# Patient Record
Sex: Male | Born: 1955 | Race: White | Hispanic: No | Marital: Married | Smoking: Never smoker
Health system: Southern US, Community
[De-identification: ages and names within clinical notes are randomized; demographics above are authoritative.]

## PROBLEM LIST (undated history)

## (undated) DIAGNOSIS — G4730 Sleep apnea, unspecified: Secondary | ICD-10-CM

## (undated) DIAGNOSIS — J189 Pneumonia, unspecified organism: Secondary | ICD-10-CM

## (undated) DIAGNOSIS — C44310 Basal cell carcinoma of skin of unspecified parts of face: Secondary | ICD-10-CM

## (undated) DIAGNOSIS — K4090 Unilateral inguinal hernia, without obstruction or gangrene, not specified as recurrent: Secondary | ICD-10-CM

## (undated) DIAGNOSIS — Z87442 Personal history of urinary calculi: Secondary | ICD-10-CM

## (undated) DIAGNOSIS — I10 Essential (primary) hypertension: Secondary | ICD-10-CM

## (undated) HISTORY — PX: INGUINAL HERNIA REPAIR: SUR1180

## (undated) HISTORY — DX: Unilateral inguinal hernia, without obstruction or gangrene, not specified as recurrent: K40.90

## (undated) HISTORY — DX: Essential (primary) hypertension: I10

## (undated) HISTORY — PX: LITHOTRIPSY: SUR834

## (undated) HISTORY — PX: CYSTOSCOPY W/ STONE MANIPULATION: SHX1427

---

## 1997-09-27 ENCOUNTER — Emergency Department (HOSPITAL_COMMUNITY): Admission: EM | Admit: 1997-09-27 | Discharge: 1997-09-27 | Payer: Self-pay | Admitting: Internal Medicine

## 1998-03-19 ENCOUNTER — Ambulatory Visit (HOSPITAL_COMMUNITY): Admission: RE | Admit: 1998-03-19 | Discharge: 1998-03-19 | Payer: Self-pay | Admitting: Family Medicine

## 1998-03-19 ENCOUNTER — Encounter: Payer: Self-pay | Admitting: Family Medicine

## 1999-09-11 ENCOUNTER — Ambulatory Visit (HOSPITAL_COMMUNITY): Admission: RE | Admit: 1999-09-11 | Discharge: 1999-09-11 | Payer: Self-pay | Admitting: Urology

## 1999-09-11 ENCOUNTER — Encounter: Payer: Self-pay | Admitting: Urology

## 2001-08-09 ENCOUNTER — Encounter: Payer: Self-pay | Admitting: Emergency Medicine

## 2001-08-09 ENCOUNTER — Emergency Department (HOSPITAL_COMMUNITY): Admission: EM | Admit: 2001-08-09 | Discharge: 2001-08-09 | Payer: Self-pay | Admitting: Emergency Medicine

## 2001-10-26 ENCOUNTER — Ambulatory Visit (HOSPITAL_BASED_OUTPATIENT_CLINIC_OR_DEPARTMENT_OTHER): Admission: RE | Admit: 2001-10-26 | Discharge: 2001-10-26 | Payer: Self-pay | Admitting: Otolaryngology

## 2002-02-09 HISTORY — PX: NASAL SEPTUM SURGERY: SHX37

## 2002-03-16 ENCOUNTER — Ambulatory Visit (HOSPITAL_COMMUNITY): Admission: RE | Admit: 2002-03-16 | Discharge: 2002-03-17 | Payer: Self-pay | Admitting: Otolaryngology

## 2004-02-10 DIAGNOSIS — C44310 Basal cell carcinoma of skin of unspecified parts of face: Secondary | ICD-10-CM

## 2004-02-10 HISTORY — DX: Basal cell carcinoma of skin of unspecified parts of face: C44.310

## 2004-02-10 HISTORY — PX: MOHS SURGERY: SUR867

## 2006-01-20 ENCOUNTER — Ambulatory Visit: Payer: Self-pay | Admitting: Internal Medicine

## 2006-01-20 LAB — CONVERTED CEMR LAB
ALT: 31 units/L (ref 0–40)
AST: 27 units/L (ref 0–37)
Albumin: 4.1 g/dL (ref 3.5–5.2)
Alkaline Phosphatase: 60 units/L (ref 39–117)
BUN: 17 mg/dL (ref 6–23)
CO2: 29 meq/L (ref 19–32)
Calcium: 9.1 mg/dL (ref 8.4–10.5)
Chloride: 103 meq/L (ref 96–112)
Creatinine, Ser: 1.3 mg/dL (ref 0.4–1.5)
GFR calc non Af Amer: 63 mL/min
Glomerular Filtration Rate, Af Am: 76 mL/min/{1.73_m2}
Glucose, Bld: 86 mg/dL (ref 70–99)
HCT: 43.6 % (ref 39.0–52.0)
Hemoglobin: 14.9 g/dL (ref 13.0–17.0)
MCHC: 34.2 g/dL (ref 30.0–36.0)
MCV: 88.9 fL (ref 78.0–100.0)
Platelets: 211 10*3/uL (ref 150–400)
Potassium: 4.3 meq/L (ref 3.5–5.1)
RBC: 4.91 M/uL (ref 4.22–5.81)
RDW: 11.7 % (ref 11.5–14.6)
Sodium: 139 meq/L (ref 135–145)
TSH: 0.92 microintl units/mL (ref 0.35–5.50)
Total Bilirubin: 0.8 mg/dL (ref 0.3–1.2)
Total Protein: 6.1 g/dL (ref 6.0–8.3)
WBC: 7 10*3/uL (ref 4.5–10.5)

## 2006-02-09 HISTORY — PX: LITHOTRIPSY: SUR834

## 2006-11-10 ENCOUNTER — Encounter: Payer: Self-pay | Admitting: Internal Medicine

## 2006-11-21 ENCOUNTER — Emergency Department (HOSPITAL_COMMUNITY): Admission: EM | Admit: 2006-11-21 | Discharge: 2006-11-22 | Payer: Self-pay | Admitting: Emergency Medicine

## 2006-11-25 ENCOUNTER — Ambulatory Visit (HOSPITAL_BASED_OUTPATIENT_CLINIC_OR_DEPARTMENT_OTHER): Admission: RE | Admit: 2006-11-25 | Discharge: 2006-11-25 | Payer: Self-pay | Admitting: Urology

## 2006-12-24 ENCOUNTER — Encounter: Payer: Self-pay | Admitting: Internal Medicine

## 2007-01-20 ENCOUNTER — Ambulatory Visit (HOSPITAL_COMMUNITY): Admission: RE | Admit: 2007-01-20 | Discharge: 2007-01-20 | Payer: Self-pay | Admitting: Urology

## 2008-10-26 IMAGING — CR DG ABDOMEN 1V
1 series · 1 of 1 positions shown · non-contrast
Comparison: none

CLINICAL DATA: Right renal calculus, pre-lithotripsy today. 
ABDOMEN  - 1 VIEW:

[t abdomen supine]
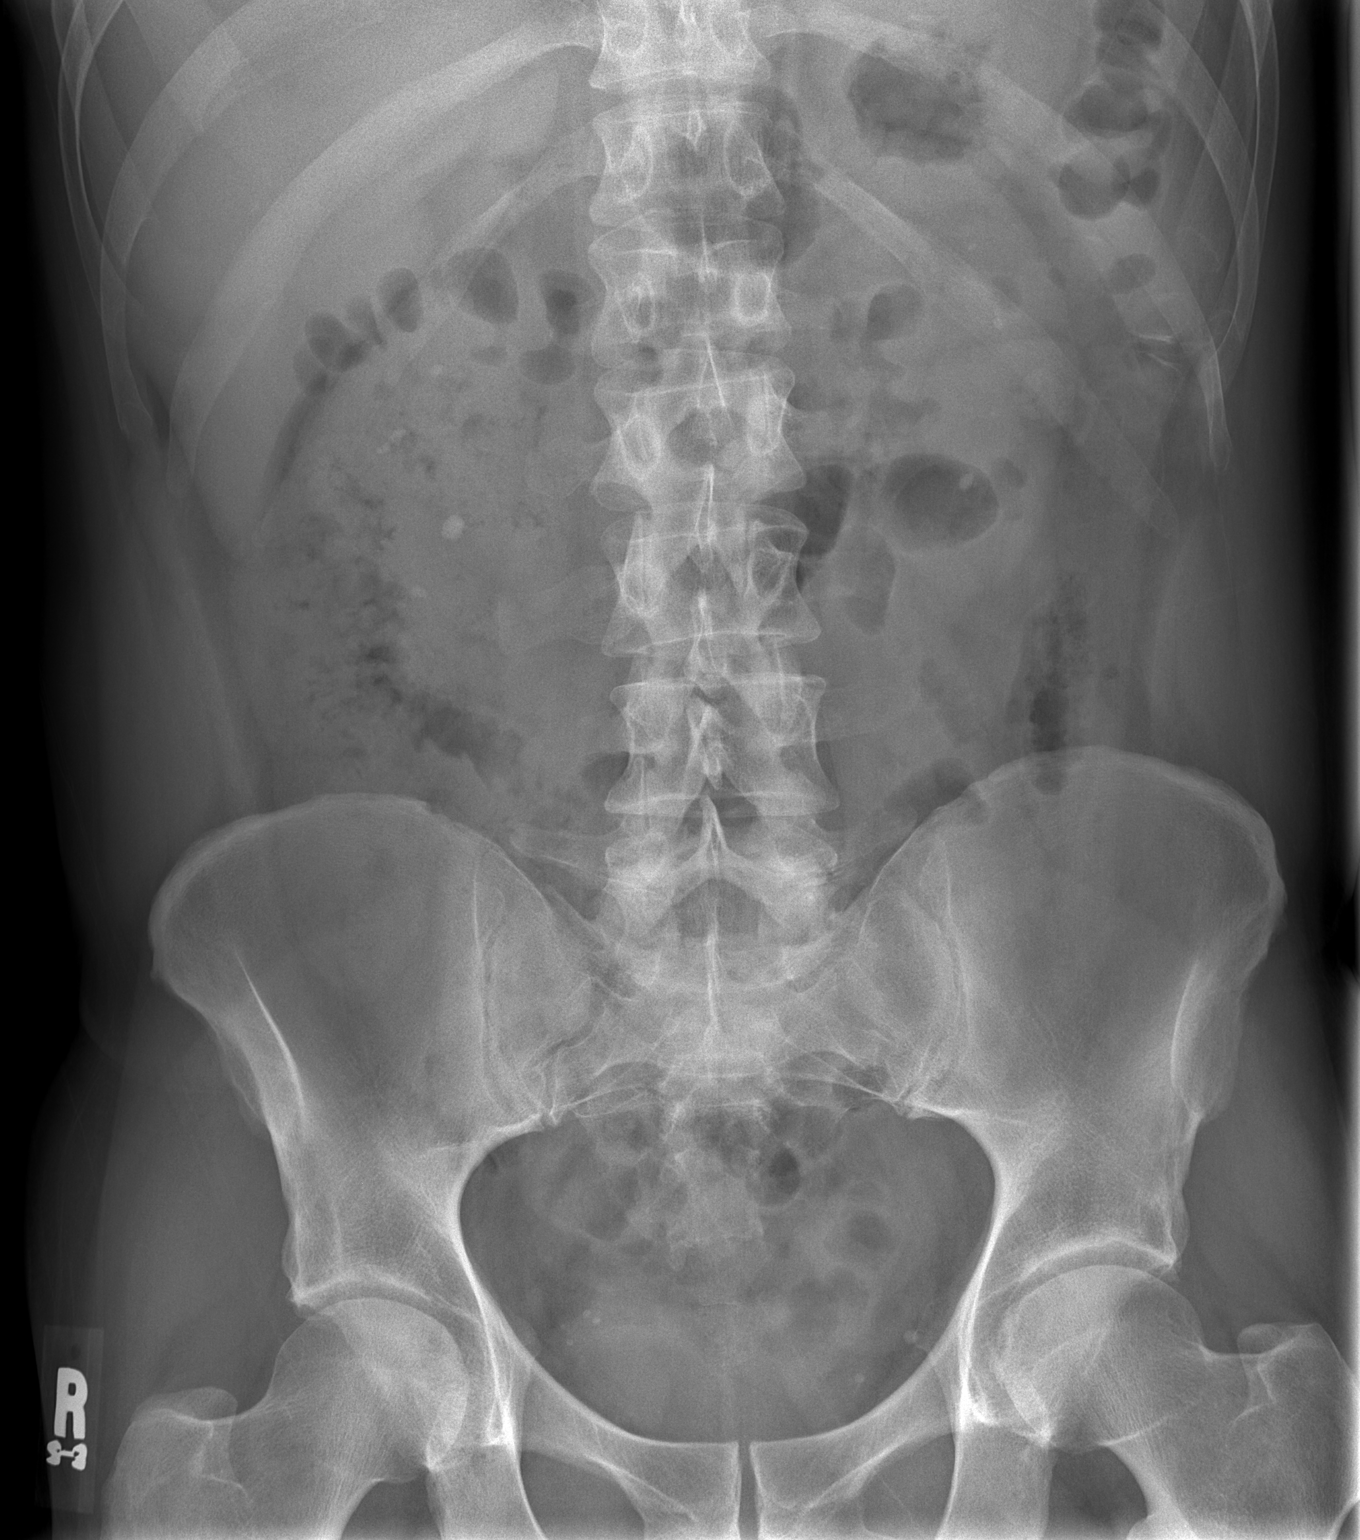

[1 of 1 positions shown; findings below may reference images not displayed]

FINDINGS: Multiple small calcifications in the low true pelvis compatible with phleboliths.  Multiple bilateral renal calcifications, the largest measuring 7.0 x 7.4 mm in the medial aspect of the right collecting system. It may lie in the renal pelvis.   The bowel gas pattern is unremarkable.
IMPRESSION: Bilateral renal calculi.

## 2009-01-23 ENCOUNTER — Telehealth: Payer: Self-pay | Admitting: Internal Medicine

## 2009-01-25 ENCOUNTER — Ambulatory Visit: Payer: Self-pay | Admitting: Family Medicine

## 2009-01-25 DIAGNOSIS — IMO0001 Reserved for inherently not codable concepts without codable children: Secondary | ICD-10-CM

## 2009-01-25 DIAGNOSIS — Z87442 Personal history of urinary calculi: Secondary | ICD-10-CM

## 2009-01-25 DIAGNOSIS — Z85828 Personal history of other malignant neoplasm of skin: Secondary | ICD-10-CM | POA: Insufficient documentation

## 2009-01-28 ENCOUNTER — Ambulatory Visit: Payer: Self-pay | Admitting: Internal Medicine

## 2009-01-28 LAB — CONVERTED CEMR LAB
ALT: 27 units/L (ref 0–53)
AST: 21 units/L (ref 0–37)
Albumin: 4.6 g/dL (ref 3.5–5.2)
Alkaline Phosphatase: 48 units/L (ref 39–117)
Anti Nuclear Antibody(ANA): NEGATIVE
BUN: 21 mg/dL (ref 6–23)
Basophils Absolute: 0 10*3/uL (ref 0.0–0.1)
Basophils Relative: 0 % (ref 0–1)
CO2: 28 meq/L (ref 19–32)
Calcium: 9.7 mg/dL (ref 8.4–10.5)
Chloride: 103 meq/L (ref 96–112)
Creatinine, Ser: 1.16 mg/dL (ref 0.40–1.50)
Eosinophils Absolute: 0.1 10*3/uL (ref 0.0–0.7)
Eosinophils Relative: 1 % (ref 0–5)
Folate: >20 ng/mL
Glucose, Bld: 87 mg/dL (ref 70–99)
HCT: 43.4 % (ref 39.0–52.0)
Hemoglobin: 15.2 g/dL (ref 13.0–17.0)
Lymphocytes Relative: 22 % (ref 12–46)
Lymphs Abs: 1.5 10*3/uL (ref 0.7–4.0)
MCHC: 35 g/dL (ref 30.0–36.0)
MCV: 85.6 fL (ref 78.0–100.0)
Monocytes Absolute: 0.6 10*3/uL (ref 0.1–1.0)
Monocytes Relative: 8 % (ref 3–12)
Neutro Abs: 4.6 10*3/uL (ref 1.7–7.7)
Neutrophils Relative %: 68 % (ref 43–77)
Platelets: 194 10*3/uL (ref 150–400)
Potassium: 4.1 meq/L (ref 3.5–5.3)
RBC: 5.07 M/uL (ref 4.22–5.81)
RDW: 12 % (ref 11.5–15.5)
Rheumatoid fact SerPl-aCnc: <20 intl units/mL (ref 0–20)
Sed Rate: 2 mm/hr (ref 0–16)
Sodium: 139 meq/L (ref 135–145)
TSH: 1.223 microintl units/mL (ref 0.350–4.500)
Total Bilirubin: 0.5 mg/dL (ref 0.3–1.2)
Total Protein: 6.8 g/dL (ref 6.0–8.3)
Vit D, 25-Hydroxy: 12 ng/mL — ABNORMAL LOW (ref 30–89)
Vitamin B-12: 441 pg/mL (ref 211–911)
WBC: 6.8 10*3/uL (ref 4.0–10.5)

## 2009-02-05 ENCOUNTER — Ambulatory Visit: Payer: Self-pay | Admitting: Internal Medicine

## 2009-02-06 ENCOUNTER — Encounter (INDEPENDENT_AMBULATORY_CARE_PROVIDER_SITE_OTHER): Payer: Self-pay | Admitting: *Deleted

## 2009-02-06 LAB — CONVERTED CEMR LAB
Cholesterol: 197 mg/dL (ref 0–200)
HDL: 40.5 mg/dL (ref >39.00)
LDL Cholesterol: 129 mg/dL — ABNORMAL HIGH (ref 0–99)
PSA: 0.9 ng/mL (ref 0.10–4.00)
Total CHOL/HDL Ratio: 5
Total CK: 96 units/L (ref 7–232)
Triglycerides: 139 mg/dL (ref 0.0–149.0)
VLDL: 27.8 mg/dL (ref 0.0–40.0)

## 2010-04-07 ENCOUNTER — Encounter: Payer: Self-pay | Admitting: Family Medicine

## 2010-04-07 ENCOUNTER — Ambulatory Visit (INDEPENDENT_AMBULATORY_CARE_PROVIDER_SITE_OTHER): Payer: BC Managed Care – PPO | Admitting: Family Medicine

## 2010-04-07 DIAGNOSIS — J019 Acute sinusitis, unspecified: Secondary | ICD-10-CM | POA: Insufficient documentation

## 2010-04-17 NOTE — Assessment & Plan Note (Signed)
Summary: cold and cough since Thursday///sph   Vital Signs:  Patient profile:   55 year old male Height:      68.5 inches Weight:      165 pounds O2 Sat:      98 % on Room air Temp:     97.6 degrees F oral Pulse rate:   65 / minute BP sitting:   122 / 78  (left arm)  Vitals Entered By: Jeremy Johann CMA (April 07, 2010 1:46 PM)  O2 Flow:  Room air CC: cough yellowish/greenish mucous, congestion,x4days   History of Present Illness: 55 yo man here today for cough.  sxs started Thursday w/ nasal congestion.  progressed into cough over the weekend.  now again is primarily nasal.  drainage is yellow green in color.  low grade temp on Saturday.  took Mucinex over the weekend w/ some relief.  + facial pressure.  + sick contacts.  Current Medications (verified): 1)  Ergocalciferol 50000 Unit Caps (Ergocalciferol) .Marland Kitchen.. 1 By Mouth Once A Week X 3 Months  Allergies (verified): 1)  ! Sulfa  Review of Systems      See HPI  Physical Exam  General:  alert, well-developed, and well-nourished.   Head:  NCAT, + TTP over frontal and maxillary sinuses Eyes:  no injxn or inflammation Ears:  External ear exam shows no significant lesions or deformities.  Otoscopic examination reveals clear canals, tympanic membranes are intact bilaterally without bulging, retraction, inflammation or discharge. Hearing is grossly normal bilaterally. Nose:  + congestion and turbinate edema Mouth:  Oral mucosa and oropharynx without lesions or exudates.  Teeth in good repair.  mild PND Lungs:  normal respiratory effort, no intercostal retractions, no accessory muscle use, and normal breath sounds.   Heart:  normal rate, regular rhythm, and no murmur.     Impression & Recommendations:  Problem # 1:  SINUSITIS - ACUTE-NOS (ICD-461.9) Assessment New pt's sxs consistent w/ infxn.  start amox.  reviewed contribution of nasal allergies- pt to start OTC Claritin or Zyrtec daily.  reviewed supportive care and red  flags that should prompt return.  Pt expresses understanding and is in agreement w/ this plan. His updated medication list for this problem includes:    Amoxicillin 875 Mg Tabs (Amoxicillin) .Marland Kitchen... 1 tab by mouth two times a day x10 days.  take w/ food.  Complete Medication List: 1)  Ergocalciferol 50000 Unit Caps (Ergocalciferol) .Marland Kitchen.. 1 by mouth once a week x 3 months 2)  Amoxicillin 875 Mg Tabs (Amoxicillin) .Marland Kitchen.. 1 tab by mouth two times a day x10 days.  take w/ food.  Patient Instructions: 1)  This appears to be an early sinus infection 2)  Take the Amoxicillin as directed 3)  Continue the Mucinex 4)  Drink plenty of fluids 5)  Tylenol/ibuprofen for pain or fever 6)  Rest! 7)  Hang in there!!! Prescriptions: AMOXICILLIN 875 MG TABS (AMOXICILLIN) 1 tab by mouth two times a day x10 days.  take w/ food.  #20 x 0   Entered and Authorized by:   Neena Rhymes MD   Signed by:   Neena Rhymes MD on 04/07/2010   Method used:   Electronically to        [facility] (retail)       [address]       [place], [place]  [address]       Ph: [phone]  Fax: [phone]   RxID:   [id number]    Orders Added: 1)  Est. Patient Level III [84696]

## 2010-06-24 NOTE — Op Note (Signed)
NAME:  Ricky Harrison, Ricky Harrison NO.:  [id number]   MEDICAL RECORD NO.:  [id number]          PATIENT TYPE:  AMB   LOCATION:  [facility]                         FACILITY:  [facility]   PHYSICIAN:  Valetta Fuller, M.D.  DATE OF BIRTH:  1955/12/24   DATE OF PROCEDURE:  11/25/2006  DATE OF DISCHARGE:                               OPERATIVE REPORT   PREOPERATIVE DIAGNOSIS:  Left distal ureteral calculus.   POSTOPERATIVE DIAGNOSIS:  Left distal ureteral calculus.   PROCEDURE PERFORMED:  Cystoscopy, left retrograde pyelography,  ureteroscopy with holmium laser lithotripsy, basketing of fragments and  left double-J stent placement.   SURGEON:  Valetta Fuller, M.D.   ANESTHESIA:  General.   INDICATIONS:  Mr. Skora is a 55 year old male.  He has had about 20  clinical stone events.  He has required several interventions.  He  recently developed gross hematuria.  At that time, he had what appeared  to be a 7- to 8-mm stone in his left renal pelvis, but he was  asymptomatic.  He subsequently developed severe pain.  The stone  eventually migrated to his proximal ureter.  We originally had him on  the schedule for lithotripsy, but because of ongoing pain, the patient  came back to the office and was re-imaged.  The stone was subsequently  over the sacrum, and we felt ureteroscopy would be a better treatment  option.  This was discussed with the patient at length, and he accepted  that recommendation and actually requested intervention.  He now  presents for definitive management of this 7-mm distal left ureteral  stone.   TECHNIQUE AND FINDINGS:  The patient was brought to the operating room  where he had successful induction of general anesthesia.  He was placed  in the lithotomy position and prepped and draped in the usual manner.  Cystoscopy itself was unremarkable.   Attention was then turned towards retrograde pyelography.  An 8-French  cone-tip catheter was utilized.   Approximately 6 cm above the ureteral  vesicle junction, there was a filling defect noted with mild obstruction  and mild dilation of the collecting system.  Fluoroscopic interpretation  was performed by myself.   Guidewire was then placed to the left renal pelvis with fluoroscopic  guidance.  The cystoscope was removed, and the distal ureter was engaged  with a 6.5 [place] ureteroscope.  A 7-mm stone was encountered in the  distal ureter just beneath the junction of the mid and distal ureter.  Holmium laser lithotriptor was used to break the stone into four  fragments.  These pieces were then all basket extracted.  We did feel  that the patient would benefit from double-J stent placement due  significant ureteral inflammation and edema.  A 24-cm 6-French double-J  stent was  placed with fluoroscopic as well as direct visual guidance.  He appeared  to tolerate the procedure well.  No obvious complications occurred.  He  was given lidocaine jelly and a B&O suppository and brought to the  recovery room in stable condition.  ______________________________  Valetta Fuller, M.D.  Electronically Signed     DSG/MEDQ  D:  11/25/2006  T:  11/26/2006  Job:  [id number]

## 2010-06-27 NOTE — Op Note (Signed)
[place]  Patient:    Ricky Harrison, Ricky Harrison                      MRN: [id number] Proc. Date: 09/11/99 Adm. Date:  [id number] Disc. Date: [id number] Attending:  Monica Becton                           Operative Report  PREOPERATIVE DIAGNOSES: 1. Distal right ureteral calculus. 2. Renal colic.  POSTOPERATIVE DIAGNOSES: 1. Distal right ureteral calculus. 2. Renal colic.  OPERATION:  Cystoscopy with right rigid ureteroscopy and stone manipulation, and insertion of a double-J stent.  SURGEON:  Claudette Laws, M.D.  INDICATIONS:  This is a 55 year old man who has had renal colic now for three to four days.  A KUB x-ray showed about a 4.0 mm stone in the area of the distal right ureter.  The patient has been forcing fluids, trying to pass the stone, without success.  We offered him a ureteroscopic stone extraction.  The nature of the procedure was explained to him, including the complications such as ureteral perforation or inability to actually get the stone out or urinary tract infection, but he understands and agrees to the proposed surgery.  DESCRIPTION OF PROCEDURE:  The patient was prepped in the dorsal lithotomy position under intubated general anesthesia.  A cystoscopy was performed.  A bladder urine was submitted for CNS.  The bladder itself was normal in appearance.  No tumors, no calculi.  Normal ureteral orifices.  He had a hemorrhagic prostate, slightly enlarged.  Initially I passed up the 0.038 guide wire through a 6-French open-ended ureteral catheter.  This was positioned in the kidney with C-arm control. Next, I back-loaded the 6.5-French short rigid mini-ureteroscope over the guide wire and directed it into the distal left ureter.  The stone was visualized.  At this point I thought I had dilated the orifice satisfactorily.  I then backed out the ureteroscope and then reintroduced it along side the guide wire under direct  vision.  Again it was introduced into the distal right ureter.  The stone was seen; however, initially I tried to pass a stone basket by the stone.  I was unsuccessful.  There appeared to be a false passage at the 6 oclock position.  We then switched to a three-prong grabber, but again the instrument seemed to direct itself into the false passage; however, I was able to manipulate the stone and loosen it up and bring it down a few mm.  At this point I thought it would be safer just to put up a double-J stent and dilate the ureter for a few days, anticipating that he will pass the stone spontaneously once we dilate the ureter.  I thought it was small enough.  The guide wire was then back-loaded over an Olympus cystoscope under direct vision, and the 6-French 26.0 cm double-J stent was passed up the ureter, again using C-arm control.  The proximal lung was curled up in the kidney. The distal end was curled up in the bladder.  The bladder was emptied.  All instruments were removed, and 10 cc of _______ was instilled intraurethrally, and the patient was then taken back to the recovery room in satisfactory condition.  PLAN:  Is now to keep the double-J stent in for several days, and then remove it in the office. DD:  09/11/99 TD:  09/12/99 Job: [id number]

## 2010-06-27 NOTE — Discharge Summary (Signed)
NAME:  Ricky Harrison, Ricky Harrison                         ACCOUNT NO.:  [id number]   MEDICAL RECORD NO.:  [id number]                   PATIENT TYPE:  OIB   LOCATION:  2903                                 FACILITY:  [facility]   PHYSICIAN:  Kinnie Scales. Annalee Genta, M.D.            DATE OF BIRTH:  1955-11-04   DATE OF ADMISSION:  03/16/2002  DATE OF DISCHARGE:  03/17/2002                                 DISCHARGE SUMMARY   ADMISSION DIAGNOSES:  1. Moderately severe obstructive sleep apnea.  2. Nasal airway obstruction.  3. Basal tongue hypertrophy.   DISCHARGE DIAGNOSES:  1. Moderately severe obstructive sleep apnea.  2. Nasal airway obstruction.  3. Basal tongue hypertrophy.   PROCEDURES:  1. Nasal septoplasty.  2. Radiofrequency ablation of basal tongue hypertrophy.  3. Inferior turbinate intermural cautery.   CONDITION ON DISCHARGE:  The patient is discharged home in stable condition  in the company of his wife.   DISCHARGE MEDICATIONS:  1. Lortab elixir 2-3 teaspoons q.4-6h. as needed for pain control.  Dispense     300 mL with 1 refill.  2. Augmentin 500 mg p.o. b.i.d. for 10 days.  3. Vioxx 50 mg p.o. daily for 10 days.   DISCHARGE INSTRUCTIONS:  Pain management as above.  Activity is limited.  No  lifting, straining, or nose blowing.  Diet:  Liquid and soft diet as  tolerated.  Wound Care:  The patient will begin frequent nasal saline sprays  4 puffs per nostril every hour while awake.  He will follow up with me on  03/23/02; sooner as required.   BRIEF HISTORY:  The patient is a 55 year old white male who is referred for  evaluation of snoring, chronic nasal airway obstruction and obstructive  sleep apnea.  A patient sleep study was performed and it showed moderately  severe obstructive sleep apnea with RDI of 40, 6 events per hour, and a  moderate oxygen desaturation.  The patient was titrated on CPAP and he used  home CPAP for approximately 3-1/2 months.  He was unable to tolerate  it for  a long-term solution of his sleep apnea secondary to chronic nasal pressure  and congestion and discomfort.   Due to the patient's history and examination I recommended that we consider  him for nasal septoplasty and turbinate reduction to improve the nasal  airway and basal tongue radiofrequency ablation to reduce posterior airway  obstruction.   Prior to admission the risks, benefits, and possible complications of the  above surgical procedures were discussed in detail with the patient and his  wife and they understood and concurred with our plan for the surgery.   HOSPITAL COURSE:  The patient was admitted to the ENT service at [facility] under Dr. Thurmon Fair care on 03/16/02.  He was brought to the main  operating room at [facility] and under general anesthesia underwent  nasal septoplasty, bilateral inferior turbinate  reduction, and base and  tongue radiofrequency ablation.   Surgery was performed without complications or difficulty.  The patient was  transferred from the operating room to the recovery room in stable  condition.  After adequate recovery he was transferred to unit 2900 for  postoperative monitoring and care.  Postoperative monitoring over the first  22 hours included cardiac and pulse oximetry.  The patient was stable  throughout his first postoperative night with blood oxygen levels greater  than 95% on room air.  There is no evidence of significant airway  obstruction and the patient was comfortable.  The pain was well-controlled  with the prescribed pain medications.  He was tolerated a liquid and soft  oral diet without difficulty. He is ambulating well.  Bowel and bladder  function was normal without problems or complications.  He was discharged to  home in stable condition in the company of his wife with the above discharge  instructions.  The patient and his wife are comfortable with the discharge  planning.  He will follow up  with me in 1 week for further postoperative  care or sooner is warranted.                                               Kinnie Scales. Annalee Genta, M.D.    DLS/MEDQ  D:  16/11/9602  T:  03/17/2002  Job:  [id number]

## 2010-06-27 NOTE — Assessment & Plan Note (Signed)
[facility]                        [place] OFFICE NOTE   BORNA, WESSINGER                      MRN:          [id number]  DATE:01/20/2006                            DOB:          [birth year]    CHIEF COMPLAINT:  Fatigue.   HISTORY OF PRESENT ILLNESS:  Mr. Kepner is a 55 year old gentleman who  came here with his wife.  He has been experiencing episodes of extreme  fatigue on and off for the last year.  These episodes last only 5 to 10  minutes.  Initially, they were every 2 months and now they are more  frequent, like once a week.  There are no obvious triggers to these  events, and there are no associated chest pains, palpitations, or  headaches.  He did admit to having severe neck pain and some shoulder  pain bilaterally with these episodes, and also states that his muscles  feel locked during these spells.   PAST MEDICAL HISTORY:  1. Kidney stones several times.  2. Skin cancer treated by dermatology.  3. Status post surgery for a deviated septum.  4. Status post lithotripsy x2.   FAMILY HISTORY:  1. No history of colon or prostate cancer, or strokes.  2. Father had hypertension.  3. Father also had an MI at the age of 70.  He used to be a heavy      smoker and drinker.  4. No history of diabetes.   SOCIAL HISTORY:  Does not smoke or drink.  He is married.  They have 2  children.   MEDICATIONS:  None.   ALLERGIES:  SULFA.   REVIEW OF SYSTEMS:  Denies any fever, weight loss, diplopia, shortness  of breath, nausea.  Occasionally will have diarrhea, but no blood in the  stools.  No paresthesia.  He does admit that he was under a lot of  stress, but here lately, he is much better, and denies depression.  He  snores a lot at night.   PHYSICAL EXAM:  The patient is alert and oriented, in no apparent  distress.  He is 5 feet 7-1/4 inches tall.  Weight 159 pounds.  Afebrile.  Pulse  60.  Blood pressure 110/70.  NECK:  No  thyromegaly.  LUNGS:  Clear to auscultation bilaterally.  CARDIOVASCULAR:  Regular rate and rhythm without a murmur.  Carotid  pulses are normal.  ABDOMEN:  Nondistended.  Soft.  Good bowel sounds.  No organomegaly.  EXTREMITIES:  No edema.  NEUROLOGIC:  Reflexes are symmetric.  Muscle tone is normal.  Strength  is symmetric.  Speech is clear and fluent.   ASSESSMENT AND PLAN:  The patient presents today with episodes of  fatigue without obvious triggers.  His EKG today is normal.  The  etiology is not completely clear at this point.  The only associated  symptom is neck pain, but this is not constant and he does not have any  evidence of myelopathy on physical exam.  At this point, I talked with  the patient and we agreed that we are going to check a CBC and  comprehensive panel to rule out hypokalemia, also a TSH and a  urinalysis.  If the results are normal, he will come back in 2 months  for a checkup, or sooner if he has increase of his fatigue.     Willow Ora, MD  Electronically Signed    JP/MedQ  DD: 01/20/2006  DT: 01/20/2006  Job #: [id number]

## 2010-06-27 NOTE — Op Note (Signed)
NAME:  Ricky Harrison, Ricky Harrison                         ACCOUNT NO.:  [id number]   MEDICAL RECORD NO.:  [id number]                   PATIENT TYPE:  OIB   LOCATION:  2888                                 FACILITY:  [facility]   PHYSICIAN:  Kinnie Scales. Annalee Genta, M.D.            DATE OF BIRTH:  [birth year]   DATE OF PROCEDURE:  03/16/2002  DATE OF DISCHARGE:                                 OPERATIVE REPORT   PREOPERATIVE DIAGNOSES:  1. Moderately severe obstructive sleep apnea.  2. Nasal septal deviation with nasal obstruction.  3. Base of tongue hypertrophy.  4. Bilateral inferior turbinate nasal hypertrophy.   POSTOPERATIVE DIAGNOSES:  1. Moderately severe obstructive sleep apnea.  2. Nasal septal deviation with nasal obstruction.  3. Base of tongue hypertrophy.  4. Bilateral inferior turbinate nasal hypertrophy.   INDICATION FOR PROCEDURE:  1. Moderately severe obstructive sleep apnea.  2. Nasal septal deviation with nasal obstruction.  3. Base of tongue hypertrophy.  4. Bilateral inferior turbinate nasal hypertrophy.   PROCEDURES:  1. Nasal septoplasty.  2. Radiofrequency ablation of the base of tongue.  3. Bilateral inferior turbinate intramural cautery.   ANESTHESIA:  General endotracheal.   SURGEON:  Kinnie Scales. Annalee Genta, M.D.   COMPLICATIONS:  None.   ESTIMATED BLOOD LOSS:  Less than 100 mL.   DISPOSITION:  The patient was transferred from the operating room to the  recovery room in stable condition.  There were no complications.   BRIEF HISTORY:  The patient is a 55 year old white male who was referred for  evaluation and management of moderately severe obstructive sleep apnea.  The  patient had undergone an inpatient sleep study, which showed an RDI of 46  events per hour.  The patient had been titrated on nasal CPAP and was using  it at home for approximately three months.  Unfortunately, he was not  noticing significant improvement in daytime symptoms of fatigue and  hypersomnolence and was having significant difficulty wearing his CPAP on an  ongoing basis secondary to nasal pressure, congestion, and discomfort.  Given that history and findings, I have recommended that we consider him for  surgical intervention.  We were anticipating a staged approach to his  obstructive sleep apnea beginning with nasal septoplasty and turbinate  reduction and primary base of tongue radiofrequency ablation.  The risks  benefits, and possible complications of each of these surgical procedures  were discussed in detail with the patient, who understood and concurred with  our plan for surgery and was scheduled at [facility] main OR with a  23-hour postoperative observation in the intensive care unit to monitor for  worsening obstructive sleep apnea.   DESCRIPTION OF PROCEDURE:  The patient was brought to the operating room on  03/16/02 and placed in the supine position on the operating table.  General  endotracheal anesthesia was established without difficulty, and when the  patient was adequately anesthetized, he was  injected with 12 mL of 1%  lidocaine and 1:100,000 solution of epinephrine injected in a submucosal  fashion along the nasal septum and inferior turbinates bilaterally.  The  patient's nose was then packed with Afrin-soaked cottonoid pledgets and left  in place for approximately 10 minutes for adequate vasoconstriction.  The  patient was prepped and draped in a sterile fashion and the surgical  procedure was begun with nasal septoplasty, inferior turbinate reduction.  The cotton pledgets were removed.  The patient was found to have a severely  deviated nasal septum with bilateral nasal obstruction and a large right  inferior septal spur.  A right hemitransfixion incision was created and a  mucoperichondrial flap was elevated from anterior to posterior along the  patient's right-hand side.  The bony-cartilaginous junction was crossed in  the midline  and a mucoperiosteal flap was elevated on the patient's left-  hand side.  Deviated bone and cartilage in the mid- and posterior aspects of  the nasal septum were then resected.  Anterior columella and dorsal  cartilage was not deviated, and this was left intact.  Cartilage from the  midaspect of the septum was removed, morcellized, and returned to the  mucoperichondrial pocket, and a 4 mm osteotome was used to remove a large  right inferior septal bone spur, preserving the overlying mucosa.  The  mucoperichondrial flaps were reapproximated with a 4-0 gut suture on a Keith  needle in a horizontal mattressing fashion.  The hemitransfixion incision  was closed with the same stitch, and bilateral Doyle nasal septal splints  were placed and sutured in position with 3-0 Ethilon suture after the  application of Bactroban ointment.   Attention was then turned to the inferior turbinates, where bilateral  inferior turbinate intramural cautery was performed.  With the cautery set  at 12 watts, two passes were made in submucosal fashion in each inferior  turbinate.  The turbinates were adequately cauterized and outfractured to  create a more patent nasal cavity.  The patient's nasal cavity and  nasopharynx were then irrigated and suctioned.  Attention was then turned to  the patient's oral cavity.  He was found to have significant base of tongue  hypertrophy.  Using the somnoplasty device with a dual probe tongue base  handpiece, radiofrequency ablation of the tongue base was undertaken.  The  procedure was accomplished with three midline lesions beginning  approximately 1 cm posterior to the circumvallate papillae.  Each surgical  site was marked and injected with approximately 5 mL of sterile saline using  a 20-gauge spinal needle.  After each injection, 1200 joules of energy were  delivered through the dual handpiece into the tongue base.  This was done three times, 1 cm posterior to the  circumvallate papillae, at the level of  the circumvallate papillae, and then 1 cm anterior.  It was accomplished  without complication or difficulty, and there was minimal bleeding.  The  patient's oral cavity and oropharynx were then irrigated and suctioned.  An  orogastric tube was passed and the stomach contents were aspirated.  The  patient was then awakened from his anesthetic.  He was extubated and was  taken from the operating room to the recovery room in stable condition.  There were no complications.  Estimated blood loss was less than 100 mL.  Kinnie Scales. Annalee Genta, M.D.    DLS/MEDQ  D:  16/11/9602  T:  03/16/2002  Job:  [id number]

## 2010-11-20 LAB — DIFFERENTIAL
Basophils Absolute: 0
Eosinophils Absolute: 0
Eosinophils Relative: 0
Lymphocytes Relative: 6 — ABNORMAL LOW
Monocytes Absolute: 0.7

## 2010-11-20 LAB — URINALYSIS, ROUTINE W REFLEX MICROSCOPIC
Bilirubin Urine: NEGATIVE
Glucose, UA: NEGATIVE
Protein, ur: NEGATIVE
Urobilinogen, UA: 0.2

## 2010-11-20 LAB — CBC
HCT: 40.7
Hemoglobin: 14.4
MCV: 86.4
Platelets: 180
RDW: 11.7

## 2010-11-20 LAB — BASIC METABOLIC PANEL
BUN: 18
CO2: 30
Chloride: 98
GFR calc non Af Amer: 35 — ABNORMAL LOW
Glucose, Bld: 116 — ABNORMAL HIGH
Potassium: 4.1
Sodium: 137

## 2010-11-20 LAB — URINE MICROSCOPIC-ADD ON

## 2011-07-13 ENCOUNTER — Ambulatory Visit (INDEPENDENT_AMBULATORY_CARE_PROVIDER_SITE_OTHER): Payer: BC Managed Care – PPO | Admitting: Family Medicine

## 2011-07-13 ENCOUNTER — Encounter: Payer: Self-pay | Admitting: Family Medicine

## 2011-07-13 VITALS — BP 112/70 | HR 66 | Temp 98.3°F | Ht 67.0 in | Wt 161.6 lb

## 2011-07-13 DIAGNOSIS — J019 Acute sinusitis, unspecified: Secondary | ICD-10-CM

## 2011-07-13 MED ORDER — BENZONATATE 200 MG PO CAPS: 200.0000 mg | ORAL_CAPSULE | Freq: Three times a day (TID) | ORAL | Status: AC | PRN

## 2011-07-13 MED ORDER — AMOXICILLIN 875 MG PO TABS: 875.0000 mg | ORAL_TABLET | Freq: Two times a day (BID) | ORAL | Status: DC

## 2011-07-13 NOTE — Patient Instructions (Signed)
This is a sinus infection Start the Amoxicillin twice daily- take w/ food Use the cough meds as needed Drink plenty of fluids REST! Hang in there!!!

## 2011-07-13 NOTE — Assessment & Plan Note (Signed)
Pt's sxs and PE consistent w/ infxn.  Start abx.  Reviewed supportive care and red flags that should prompt return.  Pt expressed understanding and is in agreement w/ plan.  

## 2011-07-13 NOTE — Progress Notes (Signed)
  Subjective:    Patient ID: Ricky Harrison, male    DOB: 08-08-1955, 56 y.o.   MRN: [id number]  HPI URI- sxs started Thursday w/ nasal congestion, itchy watery eyes, cough, malaise.  + body aches.  No fevers.  + sick contacts.  Nasal drainage now thicker and yellow.  Hx of sinus infxn previously.  Some maxillary sinus pressure.  + nausea, no vomiting, no diarrhea.   Review of Systems For ROS see HPI     Objective:   Physical Exam  Vitals reviewed. Constitutional: He appears well-developed and well-nourished. No distress.  HENT:  Head: Normocephalic and atraumatic.  Right Ear: Tympanic membrane normal.  Left Ear: Tympanic membrane normal.  Nose: Mucosal edema and rhinorrhea present. Right sinus exhibits maxillary sinus tenderness. Right sinus exhibits no frontal sinus tenderness. Left sinus exhibits maxillary sinus tenderness. Left sinus exhibits no frontal sinus tenderness.  Mouth/Throat: Mucous membranes are normal. Oropharyngeal exudate and posterior oropharyngeal erythema present. No posterior oropharyngeal edema.       + PND  Eyes: Conjunctivae and EOM are normal. Pupils are equal, round, and reactive to light.  Neck: Normal range of motion. Neck supple.  Cardiovascular: Normal rate, regular rhythm and normal heart sounds.   Pulmonary/Chest: Effort normal and breath sounds normal. No respiratory distress. He has no wheezes.       + hacking cough  Lymphadenopathy:    He has no cervical adenopathy.  Skin: Skin is warm and dry.          Assessment & Plan:

## 2011-11-24 ENCOUNTER — Encounter: Payer: Self-pay | Admitting: Internal Medicine

## 2011-11-24 ENCOUNTER — Ambulatory Visit (INDEPENDENT_AMBULATORY_CARE_PROVIDER_SITE_OTHER): Payer: BC Managed Care – PPO | Admitting: Internal Medicine

## 2011-11-24 VITALS — BP 118/82 | HR 72 | Temp 98.2°F | Wt 160.0 lb

## 2011-11-24 DIAGNOSIS — K4090 Unilateral inguinal hernia, without obstruction or gangrene, not specified as recurrent: Secondary | ICD-10-CM | POA: Insufficient documentation

## 2011-11-24 NOTE — Progress Notes (Signed)
  Subjective:    Patient ID: Ricky Harrison, male    DOB: [birth year], 56 y.o.   MRN: [id number]  HPI Acute visit One month history of on and off bulging mass in the left inguinal area. Some discomfort.   Past Medical History: Nephrolithiasis, passed several stones before ,sees urology from time to time  Skin cancer, hx of-- [facility]  Past Surgical History: lithotripsy  deviated septum skin Ca ([facility]) 3 different places on face   Family History: Family History of  CAD-- MI F   at 54yo F - deceased MI age 39 PVD-- F carotid artery surgery age 53 M- living DM - no stroke - no HTN - F colon Ca - no prostate Ca - no  Social History: Occupation: Ecologist, 2 CHILDREN   Never Smoked Alcohol use-yes Drug use-no   Review of Systems Denies abdominal pain, nausea vomiting. No fever chills No dysuria gross hematuria No scrotal pain or swelling.    Objective:   Physical Exam General -- alert, well-developed, and well-nourished.    Abdomen--soft, non-tender, no distention, no masses, no HSM, no guarding, and no rigidity. Right inguinal area normal Left inguinal area has a reducible, soft, nontender mass of approximately 4x3 cm. Very mild discomfort with palpation. GU-- scrotum and scrotal contents normal, penis without lesions or discharge or  Extremities-- no pretibial edema bilaterally Neurologic-- alert & oriented X3 and strength normal in all extremities. Psych-- Cognition and judgment appear intact. Alert and cooperative with normal attention span and concentration.  not anxious appearing and not depressed appearing.        Assessment & Plan:    Impression Recommend to come back for a physical at his convenience

## 2011-11-24 NOTE — Assessment & Plan Note (Signed)
Symptoms findings consistent with inguinal hernia, signs of incarceration discussed, if that happens needs to go to the ER. Otherwise, will refer him to surgery for possible hernia correction.

## 2011-11-24 NOTE — Patient Instructions (Addendum)

## 2011-12-14 ENCOUNTER — Ambulatory Visit (INDEPENDENT_AMBULATORY_CARE_PROVIDER_SITE_OTHER): Payer: BC Managed Care – PPO | Admitting: General Surgery

## 2011-12-14 ENCOUNTER — Encounter (INDEPENDENT_AMBULATORY_CARE_PROVIDER_SITE_OTHER): Payer: Self-pay | Admitting: General Surgery

## 2011-12-14 VITALS — BP 152/100 | HR 76 | Temp 97.8°F | Resp 16 | Ht 68.0 in | Wt 158.5 lb

## 2011-12-14 DIAGNOSIS — K4090 Unilateral inguinal hernia, without obstruction or gangrene, not specified as recurrent: Secondary | ICD-10-CM

## 2011-12-14 NOTE — Patient Instructions (Signed)
You have a reducible left inguinal hernia.  We have discussed the natural history of your hernia, and the different techniques of repair.  You'll be scheduled for a laparoscopic repair of your left inguinal hernia with mesh, possible open repair, in the near future.      Hernia Repair with Laparoscope A hernia occurs when an internal organ pushes out through a weak spot in the belly (abdominal) wall muscles. Hernias most commonly occur in the groin and around the navel. Hernias can also occur through a cut by the surgeon (incision) after an abdominal operation. A hernia may be caused by:  Lifting heavy objects.  Prolonged coughing.  Straining to move your bowels. Hernias can often be pushed back into place (reduced). Most hernias tend to get worse over time. Problems occur when abdominal contents get stuck in the opening and the blood supply is blocked or impaired (incarcerated hernia). Because of these risks, you require surgery to repair the hernia. Your hernia will be repaired using a laparoscope. Laparoscopic surgery is a type of minimally invasive surgery. It does not involve making a typical surgical cut (incision) in the skin. A laparoscope is a telescope-like rod and lens system. It is usually connected to a video camera and a light source so your caregiver can clearly see the operative area. The instruments are inserted through  to  inch (5 mm or 10 mm) openings in the skin at specific locations. A working and viewing space is created by blowing a small amount of carbon dioxide gas into the abdominal cavity. The abdomen is essentially blown up like a balloon (insufflated). This elevates the abdominal wall above the internal organs like a dome. The carbon dioxide gas is common to the human body and can be absorbed by tissue and removed by the respiratory system. Once the repair is completed, the small incisions will be closed with either stitches (sutures) or staples (just like a paper  stapler only this staple holds the skin together). LET YOUR CAREGIVERS KNOW ABOUT:  Allergies.  Medications taken including herbs, eye drops, over the counter medications, and creams.  Use of steroids (by mouth or creams).  Previous problems with anesthetics or Novocaine.  Possibility of pregnancy, if this applies.  History of blood clots (thrombophlebitis).  History of bleeding or blood problems.  Previous surgery.  Other health problems. BEFORE THE PROCEDURE  Laparoscopy can be done either in a hospital or out-patient clinic. You may be given a mild sedative to help you relax before the procedure. Once in the operating room, you will be given a general anesthesia to make you sleep (unless you and your caregiver choose a different anesthetic).  AFTER THE PROCEDURE  After the procedure you will be watched in a recovery area. Depending on what type of hernia was repaired, you might be admitted to the hospital or you might go home the same day. With this procedure you may have less pain and scarring. This usually results in a quicker recovery and less risk of infection. HOME CARE INSTRUCTIONS   Bed rest is not required. You may continue your normal activities but avoid heavy lifting (more than 10 pounds) or straining.  Cough gently. If you are a smoker it is best to stop, as even the best hernia repair can break down with the continual strain of coughing.  Avoid driving until given the OK by your surgeon.  There are no dietary restrictions unless given otherwise.  TAKE ALL MEDICATIONS AS DIRECTED.  Only take  over-the-counter or prescription medicines for pain, discomfort, or fever as directed by your caregiver. SEEK MEDICAL CARE IF:   There is increasing abdominal pain or pain in your incisions.  There is more bleeding from incisions, other than minimal spotting.  You feel light headed or faint.  You develop an unexplained fever, chills, and/or an oral temperature above  102 F (38.9 C).  You have redness, swelling, or increasing pain in the wound.  Pus coming from wound.  A foul smell coming from the wound or dressings. SEEK IMMEDIATE MEDICAL CARE IF:   You develop a rash.  You have difficulty breathing.  You have any allergic problems. MAKE SURE YOU:   Understand these instructions.  Will watch your condition.  Will get help right away if you are not doing well or get worse. Document Released: 01/26/2005 Document Revised: 04/20/2011 Document Reviewed: 12/26/2008 [facility] Patient Information 2013 [place], [place].

## 2011-12-14 NOTE — Progress Notes (Signed)
Patient ID: Ricky Harrison, male   DOB: 09/28/1955, 56 y.o.   MRN: [id number]  Chief Complaint  Patient presents with  . Inguinal Hernia    HPI Ricky Harrison is a 56 y.o. male.  He is referred to me by Dr. Drue Novel for evaluation of a left inguinal hernia.  This is a healthy gentleman who works in [employer]. He runs frequently. He has noticed a bulge in his left groin for about 6 weeks. This is medium-sized. He can push it back in. He has minimal pressure symptoms. No severe pain. No history of incarceration. No prior history of hernia repair.  Has had 5 lithotripsies in the past, followed by Dr. Isabel Caprice   Recent episode of hematuria which was very transient and self-limited, basically a single episode. Urine clear now. HPI  Past Medical History  Diagnosis Date  . Kidney stones   . Inguinal hernia   . Cancer 2006    basal cell skin     Past Surgical History  Procedure Date  . Lithotripsy 2008    5 times total - Dr. Isabel Caprice  . Nasal septum surgery 2004    Dr. Annalee Genta  . Mohs surgery 2006    basal cell - skin    Family History  Problem Relation Age of Onset  . Cancer Mother     throat  . Heart attack Father     Social History History  Substance Use Topics  . Smoking status: Never Smoker   . Smokeless tobacco: Not on file  . Alcohol Use: No    Allergies  Allergen Reactions  . Sulfonamide Derivatives Rash    At injection site. Made the entire vein that was injected red and spread up the arm.    No current outpatient prescriptions on file.    Review of Systems Review of Systems  Constitutional: Negative for fever, chills and unexpected weight change.  HENT: Negative for hearing loss, congestion, sore throat, trouble swallowing and voice change.   Eyes: Negative for visual disturbance.  Respiratory: Negative for cough and wheezing.   Cardiovascular: Negative for chest pain, palpitations and leg swelling.  Gastrointestinal: Negative for nausea, vomiting,  abdominal pain, diarrhea, constipation, blood in stool, abdominal distention, anal bleeding and rectal pain.  Genitourinary: Positive for hematuria. Negative for difficulty urinating.  Musculoskeletal: Negative for arthralgias.  Skin: Negative for rash and wound.  Neurological: Negative for seizures, syncope, weakness and headaches.  Hematological: Negative for adenopathy. Does not bruise/bleed easily.  Psychiatric/Behavioral: Negative for confusion.    Blood pressure 152/100, pulse 76, temperature 97.8 F (36.6 C), temperature source Temporal, resp. rate 16, height 5\' 8"  (1.727 m), weight 158 lb 8 oz (71.895 kg).  Physical Exam Physical Exam  Constitutional: He is oriented to person, place, and time. He appears well-developed and well-nourished. No distress.  HENT:  Head: Normocephalic.  Nose: Nose normal.  Mouth/Throat: No oropharyngeal exudate.  Eyes: Conjunctivae normal and EOM are normal. Pupils are equal, round, and reactive to light. Right eye exhibits no discharge. Left eye exhibits no discharge. No scleral icterus.  Neck: Normal range of motion. Neck supple. No JVD present. No tracheal deviation present. No thyromegaly present.  Cardiovascular: Normal rate, regular rhythm, normal heart sounds and intact distal pulses.   No murmur heard. Pulmonary/Chest: Effort normal and breath sounds normal. No stridor. No respiratory distress. He has no wheezes. He has no rales. He exhibits no tenderness.  Abdominal: Soft. Bowel sounds are normal. He exhibits no distension  and no mass. There is no tenderness. There is no rebound and no guarding.  Genitourinary:       Medium-sized left inguinal hernia, does not extend into the scrotum. Reducible. No evidence of hernia on the right. Penis scrotum and testes normal. No scrotal mass. Umbilicus normal.  Musculoskeletal: Normal range of motion. He exhibits no edema and no tenderness.  Lymphadenopathy:    He has no cervical adenopathy.    Neurological: He is alert and oriented to person, place, and time. He has normal reflexes. Coordination normal.  Skin: Skin is warm and dry. No rash noted. He is not diaphoretic. No erythema. No pallor.  Psychiatric: He has a normal mood and affect. His behavior is normal. Judgment and thought content normal.    Data Reviewed Office notes from Dr. Leta Jungling office.  Assessment    Left internal hernia, reducible  History kidney stones, multiple episodes of lithotripsy    Plan    I had a long discussion about the anatomy and natural history of his left inguinal hernia. We talked about open repair and laparoscopic repair. He would like to have this done laparoscopically in the near future.  He will be scheduled for a laparoscopic repair of his left ingunal  hernia with mesh, possible open in the near future. I discussed the indications, details, techniques and numerous risk of the surgery with him. He's been given patient information booklets and I have explained all the diagrams. He understands these issues. His questions were answered. He agrees with this plan.       Angelia Mould. Derrell Lolling, M.D., [facility] General and Minimally invasive Surgery Breast and Colorectal Surgery Office:   [phone] Pager:   [phone]  12/14/2011, 10:23 AM

## 2011-12-28 ENCOUNTER — Encounter (HOSPITAL_COMMUNITY): Payer: Self-pay | Admitting: Pharmacist

## 2011-12-30 ENCOUNTER — Encounter (HOSPITAL_COMMUNITY): Payer: Self-pay

## 2011-12-30 ENCOUNTER — Encounter (HOSPITAL_COMMUNITY)
Admission: RE | Admit: 2011-12-30 | Discharge: 2011-12-30 | Disposition: A | Discharge status: Home / Self Care | Payer: BC Managed Care – PPO | Source: Ambulatory Visit | Attending: General Surgery | Admitting: General Surgery

## 2011-12-30 LAB — COMPREHENSIVE METABOLIC PANEL
BUN: 16 mg/dL (ref 6–23)
Calcium: 9.5 mg/dL (ref 8.4–10.5)
GFR calc Af Amer: 89 mL/min — ABNORMAL LOW (ref >90)
Glucose, Bld: 95 mg/dL (ref 70–99)
Total Protein: 6.7 g/dL (ref 6.0–8.3)

## 2011-12-30 LAB — CBC WITH DIFFERENTIAL/PLATELET
Basophils Relative: 0 % (ref 0–1)
Eosinophils Absolute: 0.2 10*3/uL (ref 0.0–0.7)
Eosinophils Relative: 3 % (ref 0–5)
Hemoglobin: 12.4 g/dL — ABNORMAL LOW (ref 13.0–17.0)
Lymphs Abs: 2.1 10*3/uL (ref 0.7–4.0)
MCH: 28.8 pg (ref 26.0–34.0)
MCHC: 34.6 g/dL (ref 30.0–36.0)
MCV: 83.3 fL (ref 78.0–100.0)
Monocytes Relative: 7 % (ref 3–12)
Platelets: 247 10*3/uL (ref 150–400)
RBC: 4.3 MIL/uL (ref 4.22–5.81)

## 2011-12-30 LAB — URINALYSIS, ROUTINE W REFLEX MICROSCOPIC
Ketones, ur: NEGATIVE mg/dL
Leukocytes, UA: NEGATIVE
Nitrite: NEGATIVE
Specific Gravity, Urine: 1.018 (ref 1.005–1.030)
pH: 6 (ref 5.0–8.0)

## 2011-12-30 MED ORDER — CHLORHEXIDINE GLUCONATE 4 % EX LIQD: 1.0000 "application " | Freq: Once | CUTANEOUS | Status: DC

## 2011-12-30 NOTE — Patient Instructions (Signed)
YOUR SURGERY IS SCHEDULED AT [facility]  ON:  Friday  11/22  AT 3:00 PM  REPORT TO [place] SHORT STAY [facility]ENTER AT:  1:00 PM      PHONE # FOR SHORT STAY IS [phone]  DO NOT EAT ANYTHING AFTER MIDNIGHT THE NIGHT BEFORE YOUR SURGERY.   NO FOOD, NO CHEWING GUM, NO MINTS, NO CANDIES, NO CHEWING TOBACCO. YOU MAY HAVE CLEAR LIQUIDS TO DRINK FROM MIDNIGHT NIGHT UNTIL 9:00 AM DAY OF SURGERY - LIKE WATER, COFFEE WITH SUGAR-NO MILK OR MILK PRODUCTS.  NOTHING ELSE TO DRINK AFTER 9:00 AM.  PLEASE TAKE THE FOLLOWING MEDICATIONS THE AM OF YOUR SURGERY WITH A FEW SIPS OF WATER:  NO MEDICINES TO TAKE.   IF YOU USE INHALERS--USE YOUR INHALERS THE AM OF YOUR SURGERY AND BRING INHALERS TO THE HOSPITAL -TAKE TO SURGERY.    IF YOU ARE DIABETIC:  DO NOT TAKE ANY DIABETIC MEDICATIONS THE AM OF YOUR SURGERY.  IF YOU TAKE INSULIN IN THE EVENINGS--PLEASE ONLY TAKE 1/2 NORMAL EVENING DOSE THE NIGHT BEFORE YOUR SURGERY.  NO INSULIN THE AM OF YOUR SURGERY.  IF YOU HAVE SLEEP APNEA AND USE CPAP OR BIPAP--PLEASE BRING THE MASK AND THE TUBING.  DO NOT BRING YOUR MACHINE.  DO NOT BRING VALUABLES, MONEY, CREDIT CARDS.  DO NOT WEAR JEWELRY, MAKE-UP, NAIL POLISH AND NO METAL PINS OR CLIPS IN YOUR HAIR. CONTACT LENS, DENTURES / PARTIALS, GLASSES SHOULD NOT BE WORN TO SURGERY AND IN MOST CASES-HEARING AIDS WILL NEED TO BE REMOVED.  BRING YOUR GLASSES CASE, ANY EQUIPMENT NEEDED FOR YOUR CONTACT LENS. FOR PATIENTS ADMITTED TO THE HOSPITAL--CHECK OUT TIME THE DAY OF DISCHARGE IS 11:00 AM.  ALL INPATIENT ROOMS ARE PRIVATE - WITH BATHROOM, TELEPHONE, TELEVISION AND WIFI INTERNET.  IF YOU ARE BEING DISCHARGED THE SAME DAY OF YOUR SURGERY--YOU CAN NOT DRIVE YOURSELF HOME--AND SHOULD NOT GO HOME ALONE BY TAXI OR BUS.  NO DRIVING OR OPERATING MACHINERY FOR 24 HOURS FOLLOWING ANESTHESIA / PAIN MEDICATIONS.  PLEASE MAKE ARRANGEMENTS FOR SOMEONE TO BE WITH YOU AT HOME THE FIRST 24 HOURS AFTER SURGERY. RESPONSIBLE DRIVER'S NAME  TERESA Stockinger                                               PHONE #  CELL  [phone]  OR HOME [phone]                              PLEASE READ OVER ANY  FACT SHEETS THAT YOU WERE GIVEN: MRSA INFORMATION

## 2011-12-30 NOTE — Pre-Procedure Instructions (Signed)
PREOP CBC, DIFF, CMET, UA WERE DONE TODAY AT [facility] AS PER ORDERS DR. INGRAM.  PT'S B/P ELEVATED TODAY IN PREOP AT 154/91 --BUT PT STATES HIS B/P IS NOT USUALLY ELEVATED.  IT WAS 152/100 AT DR. INGRAM'S OFFICE--BUT 118/82 AND 112/70 AT MEDICAL DOCTOR'S OFFICE VISITS.

## 2011-12-30 NOTE — Progress Notes (Signed)
12/30/11 1524  OBSTRUCTIVE SLEEP APNEA  Have you ever been diagnosed with sleep apnea through a sleep study? No  Do you snore loudly (loud enough to be heard through closed doors)?  1  Do you often feel tired, fatigued, or sleepy during the daytime? 0  Has anyone observed you stop breathing during your sleep? 1  Do you have, or are you being treated for high blood pressure? 0  BMI more than 35 kg/m2? 0  Age over 54 years old? 1  Neck circumference greater than 40 cm/18 inches? 0  Gender: 1  Obstructive Sleep Apnea Score 4   Score 4 or greater  Results sent to PCP

## 2011-12-31 NOTE — H&P (Signed)
Ricky Harrison    MRN: [id number]   Description: 56 year old male  Provider: Ernestene Mention, MD  Department: [facility]       Diagnoses     Inguinal hernia   - Primary    550.90         Vitals   BP Pulse Temp Resp Ht Wt    152/100 76 97.8 F (36.6 C) (Temporal) 16 5\' 8"  (1.727 m) 158 lb 8 oz (71.895 kg)   BMI - 24.10 kg/m2                History and Physical    Ernestene Mention, MD   Patient ID: Ricky Harrison, male   DOB: January 01, 1956, 56 y.o.   MRN: [id number]              HPI Ricky Harrison is a 56 y.o. male.  He is referred to me by Dr. Drue Novel for evaluation of a left inguinal hernia.   This is a healthy gentleman who works in [employer]. He runs frequently. He has noticed a bulge in his left groin for about 6 weeks. This is medium-sized. He can push it back in. He has minimal pressure symptoms. No severe pain. No history of incarceration. No prior history of hernia repair.   Has had 5 lithotripsies in the past, followed by Dr. Isabel Caprice   Recent episode of hematuria which was very transient and self-limited, basically a single episode. Urine clear now.       Past Medical History   Diagnosis  Date   .  Kidney stones     .  Inguinal hernia     .  Cancer  2006       basal cell skin        Past Surgical History   Procedure  Date   .  Lithotripsy  2008       5 times total - Dr. Isabel Caprice   .  Nasal septum surgery  2004       Dr. Annalee Genta   .  Mohs surgery  2006       basal cell - skin       Family History   Problem  Relation  Age of Onset   .  Cancer  Mother         throat   .  Heart attack  Father        Social History History   Substance Use Topics   .  Smoking status:  Never Smoker    .  Smokeless tobacco:  Not on file   .  Alcohol Use:  No       Allergies   Allergen  Reactions   .  Sulfonamide Derivatives  Rash       At injection site. Made the entire vein that was injected red and spread up the arm.       No  current outpatient prescriptions on file.      Review of Systems   Constitutional: Negative for fever, chills and unexpected weight change.  HENT: Negative for hearing loss, congestion, sore throat, trouble swallowing and voice change.   Eyes: Negative for visual disturbance.  Respiratory: Negative for cough and wheezing.   Cardiovascular: Negative for chest pain, palpitations and leg swelling.  Gastrointestinal: Negative for nausea, vomiting, abdominal pain, diarrhea, constipation, blood in stool, abdominal distention, anal bleeding and rectal pain.  Genitourinary: Positive for hematuria. Negative for difficulty  urinating.  Musculoskeletal: Negative for arthralgias.  Skin: Negative for rash and wound.  Neurological: Negative for seizures, syncope, weakness and headaches.  Hematological: Negative for adenopathy. Does not bruise/bleed easily.  Psychiatric/Behavioral: Negative for confusion.    Blood pressure 152/100, pulse 76, temperature 97.8 F (36.6 C), temperature source Temporal, resp. rate 16, height 5\' 8"  (1.727 m), weight 158 lb 8 oz (71.895 kg).   Physical Exam   Constitutional: He is oriented to person, place, and time. He appears well-developed and well-nourished. No distress.  HENT:   Head: Normocephalic.   Nose: Nose normal.   Mouth/Throat: No oropharyngeal exudate.  Eyes: Conjunctivae normal and EOM are normal. Pupils are equal, round, and reactive to light. Right eye exhibits no discharge. Left eye exhibits no discharge. No scleral icterus.  Neck: Normal range of motion. Neck supple. No JVD present. No tracheal deviation present. No thyromegaly present.  Cardiovascular: Normal rate, regular rhythm, normal heart sounds and intact distal pulses.    No murmur heard. Pulmonary/Chest: Effort normal and breath sounds normal. No stridor. No respiratory distress. He has no wheezes. He has no rales. He exhibits no tenderness.  Abdominal: Soft. Bowel sounds are normal. He  exhibits no distension and no mass. There is no tenderness. There is no rebound and no guarding.  Genitourinary:       Medium-sized left inguinal hernia, does not extend into the scrotum. Reducible. No evidence of hernia on the right. Penis scrotum and testes normal. No scrotal mass. Umbilicus normal.  Musculoskeletal: Normal range of motion. He exhibits no edema and no tenderness.  Lymphadenopathy:    He has no cervical adenopathy.  Neurological: He is alert and oriented to person, place, and time. He has normal reflexes. Coordination normal.  Skin: Skin is warm and dry. No rash noted. He is not diaphoretic. No erythema. No pallor.  Psychiatric: He has a normal mood and affect. His behavior is normal. Judgment and thought content normal.    Data Reviewed Office notes from Dr. Leta Jungling office.   Assessment Left internal hernia, reducible   History kidney stones, multiple episodes of lithotripsy   Plan I had a long discussion about the anatomy and natural history of his left inguinal hernia. We talked about open repair and laparoscopic repair. He would like to have this done laparoscopically in the near future.   He will be scheduled for a laparoscopic repair of his left ingunal  hernia with mesh, possible open in the near future. I discussed the indications, details, techniques and numerous risk of the surgery with him. He's been given patient information booklets and I have explained all the diagrams. He understands these issues. His questions were answered. He agrees with this plan.       Angelia Mould. Derrell Lolling, M.D., [facility] General and Minimally invasive Surgery Breast and Colorectal Surgery Office:   [phone] Pager:   [phone]

## 2012-01-01 ENCOUNTER — Ambulatory Visit (HOSPITAL_COMMUNITY): Payer: BC Managed Care – PPO | Admitting: Anesthesiology

## 2012-01-01 ENCOUNTER — Encounter (HOSPITAL_COMMUNITY): Payer: Self-pay | Admitting: Anesthesiology

## 2012-01-01 ENCOUNTER — Encounter (HOSPITAL_COMMUNITY): Payer: Self-pay | Admitting: *Deleted

## 2012-01-01 ENCOUNTER — Ambulatory Visit (HOSPITAL_COMMUNITY)
Admission: RE | Admit: 2012-01-01 | Discharge: 2012-01-01 | Disposition: A | Discharge status: Home / Self Care | Payer: BC Managed Care – PPO | Source: Ambulatory Visit | Attending: General Surgery | Admitting: General Surgery

## 2012-01-01 ENCOUNTER — Encounter (HOSPITAL_COMMUNITY)
Admission: RE | Disposition: A | Discharge status: Home / Self Care | Payer: Self-pay | Source: Ambulatory Visit | Attending: General Surgery

## 2012-01-01 DIAGNOSIS — K4090 Unilateral inguinal hernia, without obstruction or gangrene, not specified as recurrent: Secondary | ICD-10-CM | POA: Insufficient documentation

## 2012-01-01 DIAGNOSIS — Z01812 Encounter for preprocedural laboratory examination: Secondary | ICD-10-CM | POA: Insufficient documentation

## 2012-01-01 DIAGNOSIS — Z87442 Personal history of urinary calculi: Secondary | ICD-10-CM | POA: Insufficient documentation

## 2012-01-01 HISTORY — PX: INSERTION OF MESH: SHX5868

## 2012-01-01 HISTORY — PX: INGUINAL HERNIA REPAIR: SHX194

## 2012-01-01 SURGERY — REPAIR, HERNIA, INGUINAL, LAPAROSCOPIC
Anesthesia: General | Site: Abdomen | Laterality: Left | Wound class: Clean

## 2012-01-01 MED ORDER — HYDRALAZINE HCL 20 MG/ML IJ SOLN
INTRAMUSCULAR | Status: AC
  Administered 2012-01-01: 20 mg
  Filled 2012-01-01: qty 1

## 2012-01-01 MED ORDER — NEOSTIGMINE METHYLSULFATE 1 MG/ML IJ SOLN
INTRAMUSCULAR | Status: DC | PRN
  Administered 2012-01-01: 4 mg via INTRAVENOUS

## 2012-01-01 MED ORDER — ACETAMINOPHEN 10 MG/ML IV SOLN: 1000.0000 mg | Freq: Once | INTRAVENOUS | Status: DC | PRN

## 2012-01-01 MED ORDER — HYDRALAZINE HCL 20 MG/ML IJ SOLN
10.0000 mg | Freq: Once | INTRAMUSCULAR | Status: AC
  Administered 2012-01-01: 10 mg via INTRAVENOUS

## 2012-01-01 MED ORDER — CEFAZOLIN SODIUM-DEXTROSE 2-3 GM-% IV SOLR
2.0000 g | INTRAVENOUS | Status: AC
  Administered 2012-01-01: 2 g via INTRAVENOUS

## 2012-01-01 MED ORDER — PROPOFOL 10 MG/ML IV BOLUS
INTRAVENOUS | Status: DC | PRN
  Administered 2012-01-01: 170 mg via INTRAVENOUS

## 2012-01-01 MED ORDER — MEPERIDINE HCL 50 MG/ML IJ SOLN
INTRAMUSCULAR | Status: AC
  Filled 2012-01-01: qty 1

## 2012-01-01 MED ORDER — BUPIVACAINE-EPINEPHRINE 0.5% -1:200000 IJ SOLN
INTRAMUSCULAR | Status: DC | PRN
  Administered 2012-01-01: 6 mL

## 2012-01-01 MED ORDER — BUPIVACAINE-EPINEPHRINE 0.5% -1:200000 IJ SOLN
INTRAMUSCULAR | Status: AC
  Filled 2012-01-01: qty 1

## 2012-01-01 MED ORDER — HYDROMORPHONE HCL PF 1 MG/ML IJ SOLN
0.2500 mg | INTRAMUSCULAR | Status: DC | PRN
  Administered 2012-01-01 (×4): 0.5 mg via INTRAVENOUS

## 2012-01-01 MED ORDER — CISATRACURIUM BESYLATE (PF) 10 MG/5ML IV SOLN
INTRAVENOUS | Status: DC | PRN
  Administered 2012-01-01: 10 mg via INTRAVENOUS
  Administered 2012-01-01: 3 mg via INTRAVENOUS

## 2012-01-01 MED ORDER — MEPERIDINE HCL 50 MG/ML IJ SOLN
6.2500 mg | INTRAMUSCULAR | Status: DC | PRN
  Administered 2012-01-01 (×2): 12.5 mg via INTRAVENOUS

## 2012-01-01 MED ORDER — FENTANYL CITRATE 0.05 MG/ML IJ SOLN
INTRAMUSCULAR | Status: DC | PRN
  Administered 2012-01-01 (×3): 50 ug via INTRAVENOUS

## 2012-01-01 MED ORDER — CEFAZOLIN SODIUM-DEXTROSE 2-3 GM-% IV SOLR
INTRAVENOUS | Status: AC
  Filled 2012-01-01: qty 50

## 2012-01-01 MED ORDER — EPHEDRINE SULFATE 50 MG/ML IJ SOLN
INTRAMUSCULAR | Status: DC | PRN
  Administered 2012-01-01 (×2): 7.5 mg via INTRAVENOUS

## 2012-01-01 MED ORDER — PROMETHAZINE HCL 25 MG/ML IJ SOLN: 6.2500 mg | INTRAMUSCULAR | Status: DC | PRN

## 2012-01-01 MED ORDER — GLYCOPYRROLATE 0.2 MG/ML IJ SOLN
INTRAMUSCULAR | Status: DC | PRN
  Administered 2012-01-01: 0.6 mg via INTRAVENOUS

## 2012-01-01 MED ORDER — ACETAMINOPHEN 10 MG/ML IV SOLN
INTRAVENOUS | Status: DC | PRN
  Administered 2012-01-01: 1000 mg via INTRAVENOUS

## 2012-01-01 MED ORDER — OXYCODONE HCL 5 MG PO TABS: 5.0000 mg | ORAL_TABLET | Freq: Once | ORAL | Status: DC | PRN

## 2012-01-01 MED ORDER — KETOROLAC TROMETHAMINE 30 MG/ML IJ SOLN
INTRAMUSCULAR | Status: DC | PRN
  Administered 2012-01-01: 30 mg via INTRAVENOUS

## 2012-01-01 MED ORDER — MIDAZOLAM HCL 5 MG/5ML IJ SOLN
INTRAMUSCULAR | Status: DC | PRN
  Administered 2012-01-01: 2 mg via INTRAVENOUS

## 2012-01-01 MED ORDER — OXYCODONE HCL 5 MG/5ML PO SOLN
5.0000 mg | Freq: Once | ORAL | Status: DC | PRN
  Filled 2012-01-01: qty 5

## 2012-01-01 MED ORDER — HYDROCODONE-ACETAMINOPHEN 5-325 MG PO TABS: 1.0000 | ORAL_TABLET | ORAL | Status: DC | PRN

## 2012-01-01 MED ORDER — HYDROMORPHONE HCL PF 1 MG/ML IJ SOLN
INTRAMUSCULAR | Status: AC
  Filled 2012-01-01: qty 1

## 2012-01-01 MED ORDER — BUPIVACAINE-EPINEPHRINE (PF) 0.5% -1:200000 IJ SOLN
INTRAMUSCULAR | Status: AC
  Filled 2012-01-01: qty 10

## 2012-01-01 MED ORDER — ACETAMINOPHEN 10 MG/ML IV SOLN
INTRAVENOUS | Status: AC
  Filled 2012-01-01: qty 100

## 2012-01-01 MED ORDER — LACTATED RINGERS IV SOLN
INTRAVENOUS | Status: DC
  Administered 2012-01-01: 1000 mL via INTRAVENOUS
  Administered 2012-01-01 (×2): via INTRAVENOUS

## 2012-01-01 SURGICAL SUPPLY — 38 items
ADH SKN CLS APL DERMABOND .7 (GAUZE/BANDAGES/DRESSINGS) ×1
APL SKNCLS STERI-STRIP NONHPOA (GAUZE/BANDAGES/DRESSINGS)
APPLIER CLIP LOGIC TI 5 (MISCELLANEOUS) IMPLANT
APR CLP MED LRG 33X5 (MISCELLANEOUS)
BENZOIN TINCTURE PRP APPL 2/3 (GAUZE/BANDAGES/DRESSINGS) IMPLANT
CABLE HIGH FREQUENCY MONO STRZ (ELECTRODE) IMPLANT
CLOTH BEACON ORANGE TIMEOUT ST (SAFETY) ×2 IMPLANT
DECANTER SPIKE VIAL GLASS SM (MISCELLANEOUS) ×2 IMPLANT
DERMABOND ADVANCED (GAUZE/BANDAGES/DRESSINGS) ×1
DERMABOND ADVANCED .7 DNX12 (GAUZE/BANDAGES/DRESSINGS) IMPLANT
DISSECT BALLN SPACEMKR + OVL (BALLOONS) ×2
DISSECTOR BALLN SPACEMKR + OVL (BALLOONS) ×1 IMPLANT
DISSECTOR BLUNT TIP ENDO 5MM (MISCELLANEOUS) IMPLANT
DRAPE LAPAROSCOPIC ABDOMINAL (DRAPES) ×2 IMPLANT
ELECT REM PT RETURN 9FT ADLT (ELECTROSURGICAL) ×2
ELECTRODE REM PT RTRN 9FT ADLT (ELECTROSURGICAL) ×1 IMPLANT
GLOVE EUDERMIC 7 POWDERFREE (GLOVE) ×2 IMPLANT
GOWN STRL NON-REIN LRG LVL3 (GOWN DISPOSABLE) ×3 IMPLANT
GOWN STRL REIN XL XLG (GOWN DISPOSABLE) ×3 IMPLANT
KIT BASIN OR (CUSTOM PROCEDURE TRAY) ×2 IMPLANT
MESH ULTRAPRO 3X6 7.6X15CM (Mesh General) ×1 IMPLANT
NDL INSUFFLATION 14GA 120MM (NEEDLE) IMPLANT
NEEDLE INSUFFLATION 14GA 120MM (NEEDLE) IMPLANT
SCISSORS LAP 5X35 DISP (ENDOMECHANICALS) IMPLANT
SET IRRIG TUBING LAPAROSCOPIC (IRRIGATION / IRRIGATOR) IMPLANT
SOLUTION ANTI FOG 6CC (MISCELLANEOUS) ×2 IMPLANT
STOPCOCK K 69 2C6206 (IV SETS) IMPLANT
STRIP CLOSURE SKIN 1/2X4 (GAUZE/BANDAGES/DRESSINGS) IMPLANT
SUT MNCRL AB 4-0 PS2 18 (SUTURE) ×2 IMPLANT
SUT VIC AB 3-0 SH 27 (SUTURE)
SUT VIC AB 3-0 SH 27XBRD (SUTURE) IMPLANT
TACKER 5MM HERNIA 3.5CML NAB (ENDOMECHANICALS) ×2 IMPLANT
TOWEL OR 17X26 10 PK STRL BLUE (TOWEL DISPOSABLE) ×2 IMPLANT
TRAY FOLEY CATH 14FRSI W/METER (CATHETERS) ×2 IMPLANT
TRAY LAP CHOLE (CUSTOM PROCEDURE TRAY) ×2 IMPLANT
TROCAR BLADELESS OPT 5 75 (ENDOMECHANICALS) IMPLANT
TROCAR CANNULA W/PORT DUAL 5MM (MISCELLANEOUS) ×2 IMPLANT
TUBING INSUFFLATION 10FT LAP (TUBING) ×2 IMPLANT

## 2012-01-01 NOTE — Interval H&P Note (Signed)
History and Physical Interval Note:  01/01/2012 2:23 PM  Ricky Harrison  has presented today for surgery, with the diagnosis of inguinal hernia left  The goals and the various methods of treatment have been discussed with the patient and family. After consideration of risks, benefits and other options for treatment, the patient has consented to  Procedure(s) (LRB) with comments: LAPAROSCOPIC INGUINAL HERNIA (Left) - Laparoscopic Inguinal Hernia with Mesh Possible Open INSERTION OF MESH (Left) as a surgical intervention .  The patient's history has been reviewed, patient examined today, no change in status, stable for surgery.  I have reviewed the patient's chart and labs.  Questions were answered to the patient's satisfaction.     Ernestene Mention

## 2012-01-01 NOTE — Anesthesia Postprocedure Evaluation (Signed)
  Anesthesia Post-op Note  Patient: Ricky Harrison  Procedure(s) Performed: Procedure(s) (LRB): LAPAROSCOPIC INGUINAL HERNIA (Left) INSERTION OF MESH (Left)  Patient Location: PACU  Anesthesia Type: General  Level of Consciousness: awake and alert   Airway and Oxygen Therapy: Patient Spontanous Breathing  Post-op Pain: mild  Post-op Assessment: Post-op Vital signs reviewed, Patient's Cardiovascular Status Stable, Respiratory Function Stable, Patent Airway and No signs of Nausea or vomiting  Last Vitals:  Filed Vitals:   01/01/12 1745  BP: 165/83  Pulse: 64  Temp:   Resp: 12    Post-op Vital Signs: stable   Complications: No apparent anesthesia complications

## 2012-01-01 NOTE — Progress Notes (Signed)
Ambulated patient to bathroom. Voided well. Tolerated crackers and Sprite. Verbalized d/c instructions. VSS. Home with wife.

## 2012-01-01 NOTE — Transfer of Care (Signed)
Immediate Anesthesia Transfer of Care Note  Patient: Ricky Harrison  Procedure(s) Performed: Procedure(s) (LRB) with comments: LAPAROSCOPIC INGUINAL HERNIA (Left) INSERTION OF MESH (Left)  Patient Location: PACU  Anesthesia Type:General  Level of Consciousness: awake, sedated and patient cooperative  Airway & Oxygen Therapy: Patient Spontanous Breathing and Patient connected to face mask oxygen  Post-op Assessment: Report given to PACU RN and Post -op Vital signs reviewed and stable  Post vital signs: Reviewed and stable  Complications: No apparent anesthesia complications

## 2012-01-01 NOTE — Op Note (Signed)
Patient Name:           Ricky Harrison   Date of Surgery:        01/01/2012  Pre op Diagnosis:      Left inguinal hernia  Post op Diagnosis:    Indirect left inguinal hernia  Procedure:                 Laparoscopic, preperitoneal repair of left inguinal hernia with UltraPro mesh  Surgeon:                     Angelia Mould. Derrell Lolling, M.D., FACS  Assistant:                      none  Operative Indications:   Ricky Harrison is a 56 y.o. male. He is referred to me by Dr. Drue Novel for evaluation of a left inguinal hernia.  This is a healthy gentleman who works in [employer]. He runs frequently. He has noticed a bulge in his left groin for about 6 weeks. This is medium-sized. He can push it back in. He has minimal pressure symptoms. No severe pain. No history of incarceration. No prior history of hernia repair.  Examination reveals a medium-sized left inguinal hernia that does not extend into the scrotum it is reducible.   Operative Findings:       He had a moderately large indirect left inguinal hernia.  Procedure in Detail:          Following the induction of general endotracheal anesthesia the patient's bladder was emptied with a catheter. Intravenous antibiotics were given. The abdomen and genitalia were prepped and draped in a sterile fashion. Surgical time out was performed. 0.5% Marcaine with epinephrine was used as local infiltration anesthetic. A curvilinear transverse incision was made at the lower rim of the umbilicus. The fascia was incised transversely exposing the medial border of the left rectus muscle. The rectus was retracted laterally. I inserted a spacemaker balloon into the left rectus sheath and it was passed in the midline down to the area above the symphysis pubis. The videocamera was inserted. The balloon was inflated under direct vision. The balloon deployed nicely, bilaterally. The anatomy was fairly clear with good visualization of the rectus muscle, preperitoneal fat,  symphysis pubis, Cooper's ligament and inferior epigastric vessels. The balloon was held in place for 5 minutes and then removed. The balloon on the trocar was inflated and secured and the trocar was connected to the insufflator at 13 mm mercury. The camera  was reinserted. Good visualization and minimal bleeding. A placed a 5 mm trocar in the midline below the umbilicus. I cleaned the peritoneum off of the right lateral muscles and under direct vision inserted a 5 mm trocar through the right lateral muscles above the anterior superior iliac spine. I then dissected out the left inguinal hernia anatomy. A pulled the peritoneum down off of the lateral abdominal muscles. I mobilized a large indirect hernia sac both medially and laterally. Slowly dissected the indirect hernia sac out of the inguinal canal and pulled it back until I had it dissected back above the level of the anterior superior iliac spine. Testicular vessels and vas deferens were identified. He did not have a direct hernia or a femoral hernia. A 3" x 6" piece of Ultra Pro mesh was inserted and positioned transversely so as to overlap the midline  slightly, overlapped Cooper's ligament inferiorly slightly, and then deployed laterally  and superiorly. The mesh was fixed with a few of the 5 mm protack tacks.  I placed a few tacks  along the superior rim of Cooper's ligament, then up along the midline and then across the posterior belly of the rectus muscle. Laterally I placed about 3 tacks above the iliopubic tract. Laterally I made sure that I could palpate the tacker through  the abdominal wall to stay above the iliopubic tract. The mesh appeared to be positioned and secured well. There was no bleeding. The pneumoperitoneum was released. The trocars were removed. The fascia below the umbilicus was closed with 2 interrupted figure-of-eight sutures of 0 Vicryl and the skin incisions were closed with subcuticular sutures of 4-0 Monocryl and Dermabond. The  patient tolerated the procedure well. He was taken to recovery in stable condition. There were no complications. EBL 10 cc. Counts correct.     Angelia Mould. Derrell Lolling, M.D., FACS General and Minimally Invasive Surgery Breast and Colorectal Surgery  01/01/2012 4:47 PM

## 2012-01-01 NOTE — Addendum Note (Signed)
Addendum  created 01/01/12 1804 by Gaetano Hawthorne, MD   Modules edited:Orders

## 2012-01-01 NOTE — Anesthesia Preprocedure Evaluation (Addendum)
Anesthesia Evaluation  Patient identified by MRN, date of birth, ID band Patient awake    Reviewed: Allergy & Precautions, H&P , NPO status , Patient's Chart, lab work & pertinent test results  Airway Mallampati: II TM Distance: >3 FB Neck ROM: Full    Dental  (+) Dental Advisory Given and Teeth Intact   Pulmonary neg pulmonary ROS,  breath sounds clear to auscultation  Pulmonary exam normal       Cardiovascular - anginaRhythm:Regular Rate:Normal     Neuro/Psych negative neurological ROS  negative psych ROS   GI/Hepatic negative GI ROS, Neg liver ROS,   Endo/Other  negative endocrine ROS  Renal/GU negative Renal ROS     Musculoskeletal negative musculoskeletal ROS (+)   Abdominal   Peds  Hematology negative hematology ROS (+)   Anesthesia Other Findings   Reproductive/Obstetrics                         Anesthesia Physical Anesthesia Plan  ASA: II  Anesthesia Plan: General   Post-op Pain Management:    Induction: Intravenous  Airway Management Planned: Oral ETT  Additional Equipment:   Intra-op Plan:   Post-operative Plan: Extubation in OR  Informed Consent: I have reviewed the patients History and Physical, chart, labs and discussed the procedure including the risks, benefits and alternatives for the proposed anesthesia with the patient or authorized representative who has indicated his/her understanding and acceptance.   Dental advisory given  Plan Discussed with: CRNA  Anesthesia Plan Comments:         Anesthesia Quick Evaluation

## 2012-01-04 ENCOUNTER — Encounter (HOSPITAL_COMMUNITY): Payer: Self-pay | Admitting: General Surgery

## 2012-01-14 ENCOUNTER — Encounter (INDEPENDENT_AMBULATORY_CARE_PROVIDER_SITE_OTHER): Payer: Self-pay | Admitting: General Surgery

## 2012-01-14 ENCOUNTER — Ambulatory Visit (INDEPENDENT_AMBULATORY_CARE_PROVIDER_SITE_OTHER): Payer: BC Managed Care – PPO | Admitting: General Surgery

## 2012-01-14 VITALS — BP 132/84 | HR 68 | Temp 98.1°F | Resp 12 | Ht 68.0 in | Wt 159.6 lb

## 2012-01-14 DIAGNOSIS — K4090 Unilateral inguinal hernia, without obstruction or gangrene, not specified as recurrent: Secondary | ICD-10-CM

## 2012-01-14 NOTE — Patient Instructions (Addendum)
You are recovering from your a laparoscopic left inguinal hernia repair without any obvious complications.  You may resume normal activities without restriction after December 22.  Return to see Dr. Derrell Lolling if new problems arise.

## 2012-01-14 NOTE — Progress Notes (Signed)
Patient ID: Ricky Harrison, male   DOB: [birth year], 56 y.o.   MRN: [id number] History: This patient underwent a laparoscopic repair left inguinal hernia with mesh on November 22. He's doing very well. Has no pain. Back to work full-time in a [employer].  Exam: Patient looks well. Thin and fit. Abdomen soft and nontender. Umbilical incision well healed. Groins are soft. No seroma. Hernia repair intact. Penis scrotum and testes normal.  Assessment: Left inguinal  hernia, uneventful recovery following laparoscopic repair with mesh  Plan: Resume normal activities after December 22 Return to see me when necessary    Angelia Mould. Derrell Lolling, M.D., [facility] General and Minimally invasive Surgery Breast and Colorectal Surgery Office:   [phone] Pager:   [phone]

## 2012-03-02 ENCOUNTER — Ambulatory Visit (INDEPENDENT_AMBULATORY_CARE_PROVIDER_SITE_OTHER): Payer: PRIVATE HEALTH INSURANCE | Admitting: Family Medicine

## 2012-03-02 ENCOUNTER — Ambulatory Visit: Payer: BC Managed Care – PPO | Admitting: Internal Medicine

## 2012-03-02 ENCOUNTER — Encounter: Payer: Self-pay | Admitting: Family Medicine

## 2012-03-02 VITALS — BP 118/72 | HR 85 | Temp 101.9°F | Wt 164.0 lb

## 2012-03-02 DIAGNOSIS — J019 Acute sinusitis, unspecified: Secondary | ICD-10-CM

## 2012-03-02 DIAGNOSIS — R509 Fever, unspecified: Secondary | ICD-10-CM

## 2012-03-02 LAB — POCT INFLUENZA A/B
Influenza A, POC: NEGATIVE
Influenza B, POC: NEGATIVE

## 2012-03-02 MED ORDER — AMOXICILLIN 875 MG PO TABS: 875.0000 mg | ORAL_TABLET | Freq: Two times a day (BID) | ORAL | Status: AC

## 2012-03-02 MED ORDER — GUAIFENESIN-CODEINE 100-10 MG/5ML PO SYRP: 10.0000 mL | ORAL_SOLUTION | Freq: Three times a day (TID) | ORAL | Status: DC | PRN

## 2012-03-02 NOTE — Progress Notes (Signed)
  Subjective:    Patient ID: Ricky Harrison, male    DOB: [birth year], 57 y.o.   MRN: [id number]  HPI Cough, fever- sxs started yesterday suddenly.  + cough- dry.  + mild body aches.  + fever- Tm 102 last night.  No N/V.  + maxillary pain.  No sore throat.  + nasal congestion.  + sick contacts.   Review of Systems For ROS see HPI     Objective:   Physical Exam  Vitals reviewed. Constitutional: He appears well-developed and well-nourished. No distress.  HENT:  Head: Normocephalic and atraumatic.  Right Ear: Tympanic membrane normal.  Left Ear: Tympanic membrane normal.  Nose: Mucosal edema and rhinorrhea present. Right sinus exhibits maxillary sinus tenderness. Right sinus exhibits no frontal sinus tenderness. Left sinus exhibits maxillary sinus tenderness. Left sinus exhibits no frontal sinus tenderness.  Mouth/Throat: Mucous membranes are normal. Oropharyngeal exudate and posterior oropharyngeal erythema present. No posterior oropharyngeal edema.       + PND  Eyes: Conjunctivae normal and EOM are normal. Pupils are equal, round, and reactive to light.  Neck: Normal range of motion. Neck supple.  Cardiovascular: Normal rate, regular rhythm and normal heart sounds.   Pulmonary/Chest: Effort normal and breath sounds normal. No respiratory distress. He has no wheezes.       + hacking cough  Lymphadenopathy:    He has no cervical adenopathy.  Skin: Skin is warm and dry.          Assessment & Plan:

## 2012-03-02 NOTE — Patient Instructions (Addendum)
This is a sinus infection Start the Amoxicillin twice daily Drink plenty of fluids Use the cough syrup as needed- will cause drowsiness REST! Call with any questions or concerns Hang in there!

## 2012-03-02 NOTE — Assessment & Plan Note (Signed)
Pt's sxs consistent w/ flu but rapid test negative in office.  + maxillary sinus pain and fever consistent w/ infxn.  Start abx.  Cough med prn.  Reviewed supportive care and red flags that should prompt return.  Pt expressed understanding and is in agreement w/ plan.

## 2014-02-09 HISTORY — PX: INGUINAL HERNIA REPAIR: SUR1180

## 2014-05-16 ENCOUNTER — Encounter: Payer: Self-pay | Admitting: Medical

## 2014-05-16 ENCOUNTER — Ambulatory Visit (INDEPENDENT_AMBULATORY_CARE_PROVIDER_SITE_OTHER): Payer: No Typology Code available for payment source | Admitting: Medical

## 2014-05-16 VITALS — BP 138/80 | HR 65 | Temp 98.4°F | Ht 68.0 in | Wt 163.4 lb

## 2014-05-16 DIAGNOSIS — R1031 Right lower quadrant pain: Secondary | ICD-10-CM | POA: Diagnosis not present

## 2014-05-16 DIAGNOSIS — K4090 Unilateral inguinal hernia, without obstruction or gangrene, not specified as recurrent: Secondary | ICD-10-CM

## 2014-05-16 DIAGNOSIS — R109 Unspecified abdominal pain: Secondary | ICD-10-CM | POA: Insufficient documentation

## 2014-05-16 LAB — CBC WITH DIFFERENTIAL/PLATELET
BASOS ABS: 0.1 10*3/uL (ref 0.0–0.1)
BASOS PCT: 0.8 % (ref 0.0–3.0)
EOS ABS: 0.1 10*3/uL (ref 0.0–0.7)
Eosinophils Relative: 1.3 % (ref 0.0–5.0)
HEMATOCRIT: 43.7 % (ref 39.0–52.0)
HEMOGLOBIN: 15.3 g/dL (ref 13.0–17.0)
Lymphocytes Relative: 15 % (ref 12.0–46.0)
Lymphs Abs: 1 10*3/uL (ref 0.7–4.0)
MCHC: 34.9 g/dL (ref 30.0–36.0)
MCV: 85 fl (ref 78.0–100.0)
Monocytes Absolute: 0.4 10*3/uL (ref 0.1–1.0)
Monocytes Relative: 5.1 % (ref 3.0–12.0)
Neutro Abs: 5.4 10*3/uL (ref 1.4–7.7)
Neutrophils Relative %: 77.8 % — ABNORMAL HIGH (ref 43.0–77.0)
Platelets: 210 10*3/uL (ref 150.0–400.0)
RBC: 5.14 Mil/uL (ref 4.22–5.81)
RDW: 12.3 % (ref 11.5–15.5)
WBC: 6.9 10*3/uL (ref 4.0–10.5)

## 2014-05-16 MED ORDER — DICLOFENAC SODIUM 75 MG PO TBEC: 75.0000 mg | DELAYED_RELEASE_TABLET | Freq: Two times a day (BID) | ORAL | Status: DC

## 2014-05-16 NOTE — Assessment & Plan Note (Addendum)
Pt appears to have rt side hernia now on exam.   Rx diclofenac for the discomfort. Will go ahead and refer to general surgery for consult.

## 2014-05-16 NOTE — Assessment & Plan Note (Signed)
Pt has hernia by exam and likely associated pain. But due to some faint rlq pain on heal jar will get cbc and assess wbc. If pain in this region worsens as discussed then ED for imaging studies.    Note also advised regarding rt groin area pain in region where he was tender on exam. If pain increasing in this area as explained would need stat imaging as well.

## 2014-05-16 NOTE — Progress Notes (Signed)
Pre visit review using our clinic review tool, if applicable. No additional management support is needed unless otherwise documented below in the visit note. 

## 2014-05-16 NOTE — Patient Instructions (Addendum)
Inguinal hernia Pt appears to have rt side hernia now on exam.   Rx diclofenac for the discomfort. Will go ahead and refer to general surgery for consult.   Abdominal pain Pt has hernia by exam and likely associated pain. But due to some faint rlq pain on heal jar will get cbc and assess wbc. If pain in this region worsens as discussed then ED for imaging studies.    Note also advised regarding rt groin area pain in region where he was tender on exam. If pain increasing in this area as explained would need stat imaging as well.     Follow up 7 days for follow up if General surgery appoitment delayed or as needed.

## 2014-05-16 NOTE — Progress Notes (Signed)
Subjective:    Patient ID: Ricky Harrison, male    DOB: [birth year], 57 y.o.   MRN: [id number]  HPI  Pt in with rt groin pain for couple of months. In afternoon he not more of discomfort. In past he had left inguinal hernia and had that repaired in 2013.   Pt states pain level can be moderate 5/10. Appetite mild decrease. No nausea, no vomiting,no fever or chills.  No testicle pain.   Moving and stooping down will feel the pain. Pain usually increases with work late in the day.  Pt has faint pain right now. 2/10.   Review of Systems  Constitutional: Negative for fever, chills and fatigue.  Respiratory: Negative for apnea, cough and wheezing.   Cardiovascular: Negative for chest pain and palpitations.  Gastrointestinal: Positive for abdominal pain. Negative for nausea, vomiting, diarrhea, constipation, blood in stool, abdominal distention and rectal pain.       Faint decrease appetite.  Musculoskeletal: Negative for back pain and gait problem.  Skin: Negative for rash.  Hematological: Negative for adenopathy. Does not bruise/bleed easily.   Past Medical History  Diagnosis Date  . Kidney stones   . Inguinal hernia     LEFT-CAUSING SOME DISCOMFORT   . Cancer 2006    basal cell skin     History   Social History  . Marital Status: Married    Spouse Name: N/A  . Number of Children: N/A  . Years of Education: N/A   Occupational History  . Not on file.   Social History Main Topics  . Smoking status: Never Smoker   . Smokeless tobacco: Not on file  . Alcohol Use: No  . Drug Use: No  . Sexual Activity: Not on file   Other Topics Concern  . Not on file   Social History Narrative    Past Surgical History  Procedure Laterality Date  . Lithotripsy  2008    5 times total - Dr. Risa Grill  . Nasal septum surgery  2004    Dr. Wilburn Cornelia  . Mohs surgery  2006    basal cell - skin  . Inguinal hernia repair  01/01/2012    Procedure: LAPAROSCOPIC INGUINAL HERNIA;  Surgeon:  Adin Hector, MD;  Location: [facility];  Service: General;  Laterality: Left;  . Insertion of mesh  01/01/2012    Procedure: INSERTION OF MESH;  Surgeon: Adin Hector, MD;  Location: [facility];  Service: General;  Laterality: Left;    Family History  Problem Relation Age of Onset  . Cancer Mother     throat  . Heart attack Father     Allergies  Allergen Reactions  . Sulfonamide Derivatives Rash    At injection site. Made the entire vein that was injected red and spread up the arm.    Current Outpatient Prescriptions on File Prior to Visit  Medication Sig Dispense Refill  . HYDROcodone-acetaminophen (NORCO/VICODIN) 5-325 MG per tablet Take 1-2 tablets by mouth every 4 (four) hours as needed for pain. 50 tablet 1  . Multiple Vitamin (MULTIVITAMIN WITH MINERALS) TABS Take 1 tablet by mouth once a week. Takes on Mondays.    . naproxen sodium (ANAPROX) 220 MG tablet Take 440 mg by mouth daily as needed. Pain     No current facility-administered medications on file prior to visit.    BP 138/80 mmHg  Pulse 65  Temp(Src) 98.4 F (36.9 C) (Oral)  Ht 5\' 8"  (1.727 m)  Wt 163 lb  6.4 oz (74.118 kg)  BMI 24.85 kg/m2  SpO2 95%       Objective:   Physical Exam  General Mental Status- Alert. Pleasant pt.  Skin General:-Normal. Color- Normal color. Moisture- Normal. Temperature-Warm.   Chest and Lung Exam Percussion: Quality and Intensity-Percussion normal. Percussion of the chest reveals- No Dullness.  Palpation: Palpation of the chest reveals- Non-tender- No dullness. Auscultation: Breath Sounds- Normal.  Adventitous Sounds:-No adventitious sounds.  Cardiovascular Inspection:- No Heaves. Auscultation:-Normal sinus rhythm without murmur gallop, S1 WNL and S2 WNL.  Abdomen Inspection:-Inspection Normal. Inspection of the abdomen reveals- No hernias Palpation/Percussion:- Palpation and Percussion of the Abdomen reveal- Non Tender and No Palpable abdominal masses.(on heal  jar faint/minimal  rlq region pain) Liver: Other Characteristics- No hepatomegaly. Spleen:Other Characteristics- No Splenomegaly. Auscultation:- Auscultation of the abdomen reveals- Bowel sounds normal and No Abdominal bruits.  Male Genitourinary Urethra:- No discharge. Penis- Circumcised. Scrotum- No masses. Testes- Bilateral-Normal. Rt inguinal canal. Mid region on coughing small buldge/hernia felt that is directly tender on palpation.The area does reduce after cough stopped. Lt inguinal canal is clear.  Back- no cva tenderness.           Assessment & Plan:

## 2014-05-23 ENCOUNTER — Ambulatory Visit: Payer: PRIVATE HEALTH INSURANCE | Admitting: Medical

## 2014-08-20 ENCOUNTER — Other Ambulatory Visit: Payer: Self-pay | Admitting: General Surgery

## 2016-06-05 ENCOUNTER — Telehealth: Payer: Self-pay

## 2016-06-05 NOTE — Telephone Encounter (Signed)
Left message on answering machine for return call to complete pre visit information.

## 2016-06-08 ENCOUNTER — Encounter: Payer: Self-pay | Admitting: Family Medicine

## 2016-06-08 ENCOUNTER — Ambulatory Visit (INDEPENDENT_AMBULATORY_CARE_PROVIDER_SITE_OTHER): Payer: No Typology Code available for payment source | Admitting: Family Medicine

## 2016-06-08 VITALS — BP 166/84 | HR 64 | Temp 98.4°F | Ht 68.0 in | Wt 164.4 lb

## 2016-06-08 DIAGNOSIS — R0789 Other chest pain: Secondary | ICD-10-CM

## 2016-06-08 NOTE — Progress Notes (Signed)
Pre visit review using our clinic review tool, if applicable. No additional management support is needed unless otherwise documented below in the visit note. 

## 2016-06-08 NOTE — Patient Instructions (Signed)
Try Prilosec 20 mg twice daily to see if that is helpful. Be mindful of gas producing foods as well.  "Stop chewing gum, drinking carbonated beverages, gulping liquids, and drinking alcohol to help with belching.  These foods may cause you to belch more: Wheat, barley, rye, onion, leek, white part of spring onion, garlic, shallots, artichokes, beetroot, fennel, peas, chicory, pistachio, cashews, legumes, lentils, and chickpeas; Milk, custard, ice cream, and yogurt; Apples, pears, mangoes, cherries, watermelon, asparagus, sugar snap peas, honey, high-fructose corn syrup; Apricots, nectarines, peaches, plums, mushrooms, cauliflower, artificially sweetened chewing gum and confectionery"  Stretching your pectorals may be helpful. Heat over the area can also be helpful.

## 2016-06-08 NOTE — Progress Notes (Signed)
Chief Complaint  Patient presents with  . Establish Care    pressure in the mid chest(last for 10-15 mins) with some SOB and lightheadedness    Ricky Harrison is a 61 y.o. male here for evaluation of central chest pain.  Duration of issue: 3 weeks Quality: Pressure Palliation: nothing, burping may help Provocation: no triggers, none noticed during pain episodes Severity: 4/10 Radiation: None Duration of chest pain: 15 minutes Associated symptoms: SOB sometimes, lightheadedness every other day, pressing on chest during episodes makes it worse Cardiac history: none Family heart history: dad had MI 34, was a smoker and drinker Smoker? No; 2nd hand smoke as a child  ROS:  Cardiac: No current chest pain Lungs: No SOB  Past Medical History:  Diagnosis Date  . Cancer ([place]) 2006   basal cell skin   . Inguinal hernia    LEFT-CAUSING SOME DISCOMFORT   . Inguinal hernia, right    Dr. Dalbert Batman  . Kidney stones    Family History  Problem Relation Age of Onset  . Cancer Mother     throat  . Heart attack Father    Social History   Social History  . Marital status: Married   Social History Main Topics  . Smoking status: Never Smoker  . Smokeless tobacco: Never Used  . Alcohol use No  . Drug use: No   Allergies as of 06/08/2016      Reactions   Sulfonamide Derivatives Rash   At injection site. Made the entire vein that was injected red and spread up the arm.      Medication List       Accurate as of 06/08/16  1:16 PM. Always use your most recent med list.          multivitamin with minerals Tabs tablet Take 1 tablet by mouth once a week. Takes on Mondays.      BP (!) 166/84 (BP Location: Left Arm, Patient Position: Sitting, Cuff Size: Normal)   Pulse 64   Temp 98.4 F (36.9 C) (Oral)   Ht 5\' 8"  (1.727 m)   Wt 164 lb 6.4 oz (74.6 kg)   SpO2 100%   BMI 25.00 kg/m  Gen- awake, alert, appears stated age HEENT: PERRLA Heart: RRR, no murmurs, no bruits, No LE  edema Lungs: CTAB, no accessory muscle use Abd: Soft, NT, ND, BS+, no masses or organomegaly Psych: Age appropriate judgment and insight, nml mood and affect  Atypical chest pain - Plan: EKG 12-Lead  Orders as above. EKG unremarkable when compared to previous. Hx not suggestive of anything cardiac related.  Monitor home BP over next couple weeks. Trial PPI, OK to use ibuprofen/Tylenol, heat, stretching. Diet for eructation decrease provided.  F/u at earliest convenience for CPE. The patient voiced understanding and agreement to the plan.  [place], DO 06/08/16 1:16 PM

## 2016-07-15 ENCOUNTER — Ambulatory Visit (INDEPENDENT_AMBULATORY_CARE_PROVIDER_SITE_OTHER): Payer: No Typology Code available for payment source | Admitting: Family Medicine

## 2016-07-15 ENCOUNTER — Encounter: Payer: Self-pay | Admitting: Family Medicine

## 2016-07-15 VITALS — BP 140/78 | HR 60 | Temp 98.8°F | Ht 68.0 in | Wt 165.8 lb

## 2016-07-15 DIAGNOSIS — R030 Elevated blood-pressure reading, without diagnosis of hypertension: Secondary | ICD-10-CM | POA: Diagnosis not present

## 2016-07-15 DIAGNOSIS — Z0000 Encounter for general adult medical examination without abnormal findings: Secondary | ICD-10-CM | POA: Diagnosis not present

## 2016-07-15 DIAGNOSIS — Z1159 Encounter for screening for other viral diseases: Secondary | ICD-10-CM | POA: Diagnosis not present

## 2016-07-15 DIAGNOSIS — Z114 Encounter for screening for human immunodeficiency virus [HIV]: Secondary | ICD-10-CM | POA: Diagnosis not present

## 2016-07-15 LAB — LIPID PANEL
CHOLESTEROL: 161 mg/dL (ref 0–200)
HDL: 35.1 mg/dL — ABNORMAL LOW (ref >39.00)
NonHDL: 126.1
Total CHOL/HDL Ratio: 5
Triglycerides: 285 mg/dL — ABNORMAL HIGH (ref 0.0–149.0)
VLDL: 57 mg/dL — ABNORMAL HIGH (ref 0.0–40.0)

## 2016-07-15 LAB — COMPREHENSIVE METABOLIC PANEL
ALBUMIN: 4.3 g/dL (ref 3.5–5.2)
ALK PHOS: 51 U/L (ref 39–117)
ALT: 22 U/L (ref 0–53)
AST: 18 U/L (ref 0–37)
BILIRUBIN TOTAL: 0.6 mg/dL (ref 0.2–1.2)
BUN: 19 mg/dL (ref 6–23)
CO2: 30 mEq/L (ref 19–32)
Calcium: 9.5 mg/dL (ref 8.4–10.5)
Chloride: 104 mEq/L (ref 96–112)
Creatinine, Ser: 1.05 mg/dL (ref 0.40–1.50)
GFR: 76.8 mL/min (ref >60.00)
Glucose, Bld: 90 mg/dL (ref 70–99)
Potassium: 4.3 mEq/L (ref 3.5–5.1)
SODIUM: 138 meq/L (ref 135–145)
Total Protein: 6.8 g/dL (ref 6.0–8.3)

## 2016-07-15 LAB — CBC
HCT: 43.3 % (ref 39.0–52.0)
Hemoglobin: 15.1 g/dL (ref 13.0–17.0)
MCHC: 34.9 g/dL (ref 30.0–36.0)
MCV: 86.5 fl (ref 78.0–100.0)
Platelets: 225 10*3/uL (ref 150.0–400.0)
RBC: 5.01 Mil/uL (ref 4.22–5.81)
RDW: 12.3 % (ref 11.5–15.5)
WBC: 7.1 10*3/uL (ref 4.0–10.5)

## 2016-07-15 LAB — LDL CHOLESTEROL, DIRECT: LDL DIRECT: 80 mg/dL

## 2016-07-15 LAB — HEPATITIS C ANTIBODY: HCV AB: NEGATIVE

## 2016-07-15 LAB — HIV ANTIBODY (ROUTINE TESTING W REFLEX): HIV 1&2 Ab, 4th Generation: NONREACTIVE

## 2016-07-15 NOTE — Progress Notes (Signed)
Chief Complaint  Patient presents with  . Annual Exam    non-fasting    [name] is here for a complete physical.   His last physical was >1 year ago.  Current diet: in general, a "healthy" diet   Current exercise: Goes to Gym 3-4x/week Weight trend: stable Does pt snore? No. Daytime fatigue? No. Seat belt? Yes.    Notes that his chest pain that I saw him for is much better (around 90%).  Health maintenance Shingrix- No Colonoscopy- No- will discuss with his wife and let us know Tetanus- Yes HIV- No Hep C- No Prostate cancer screening- No   Past Medical History:  Diagnosis Date  . Cancer ([place]) 2006   basal cell skin   . Inguinal hernia    LEFT-CAUSING SOME DISCOMFORT   . Inguinal hernia, right    Dr. Dalbert Batman  . Kidney stones     Past Surgical History:  Procedure Laterality Date  . INGUINAL HERNIA REPAIR  01/01/2012   Procedure: LAPAROSCOPIC INGUINAL HERNIA;  Surgeon: Adin Hector, MD;  Location: [facility];  Service: General;  Laterality: Left;  . INSERTION OF MESH  01/01/2012   Procedure: INSERTION OF MESH;  Surgeon: Adin Hector, MD;  Location: [facility];  Service: General;  Laterality: Left;  . LITHOTRIPSY  2008   5 times total - Dr. Risa Grill  . MOHS SURGERY  2006   basal cell - skin  . NASAL SEPTUM SURGERY  2004   Dr. Wilburn Cornelia   Medications  Current Outpatient Prescriptions on File Prior to Visit  Medication Sig Dispense Refill  . Multiple Vitamin (MULTIVITAMIN WITH MINERALS) TABS Take 1 tablet by mouth once a week. Takes on Mondays.     Allergies Allergies  Allergen Reactions  . Sulfonamide Derivatives Rash    At injection site. Made the entire vein that was injected red and spread up the arm.   Family History Family History  Problem Relation Age of Onset  . Cancer Mother        throat  . Heart attack Father     Review of Systems: Constitutional:  no unexpected change in weight, no fevers or chills Eye:  no recent significant  change in vision Ear/Nose/Mouth/Throat:  Ears:  no tinnitus or hearing loss Nose/Mouth/Throat:  no complaints of nasal congestion or bleeding, no sore throat and oral sores Cardiovascular:  no current chest pain, no palpitations Respiratory:  no cough and no shortness of breath Gastrointestinal:  no abdominal pain, no change in bowel habits, no nausea, vomiting, diarrhea, or constipation and no black or bloody stool GU:  Male: negative for dysuria, frequency, and incontinence and negative for prostate symptoms Musculoskeletal/Extremities:  no pain, redness, or swelling of the joints Integumentary (Skin/Breast):  no abnormal skin lesions reported Neurologic:  no headaches, no numbness, tingling Endocrine: No weight changes, masses in the neck, heat/cold intolerance, bowel or skin changes, or cardiovascular system symptoms Hematologic/Lymphatic:  no abnormal bleeding, no HIV risk factors, no night sweats, no swollen nodes, no weight loss  Exam BP 140/78 (BP Location: Left Arm, Patient Position: Sitting, Cuff Size: Normal)   Pulse 60   Temp 98.8 F (37.1 C) (Oral)   Ht 5\' 8"  (1.727 m)   Wt 165 lb 12.8 oz (75.2 kg)   SpO2 98%   BMI 25.21 kg/m  General:  well developed, well nourished, in no apparent distress Skin: various lentigos and SK's over back and arms (sees Derm soon) Head:  no  masses, lesions, or tenderness Eyes:  pupils equal and round, sclera anicteric without injection Ears:  canals without lesions, TMs shiny without retraction, no obvious effusion, no erythema Nose:  nares patent, septum midline, mucosa normal Throat/Pharynx:  lips and gingiva without lesion; tongue and uvula midline; non-inflamed pharynx; no exudates or postnasal drainage Neck: neck supple without adenopathy, thyromegaly, or masses Lungs:  clear to auscultation, breath sounds equal bilaterally, no respiratory distress Cardio:  regular rate and rhythm without murmurs, heart sounds without clicks or  rubs Abdomen:  abdomen soft, nontender; bowel sounds normal; no masses or organomegaly Genital (male): circumcised penis, no lesions or discharge; testes present bilaterally without masses or tenderness Rectal: Deferred Musculoskeletal:  symmetrical muscle groups noted without atrophy or deformity Extremities:  no clubbing, cyanosis, or edema, no deformities, no skin discoloration Neuro:  gait normal; deep tendon reflexes normal and symmetric Psych: well oriented with normal range of affect and appropriate judgment/insight  Assessment and Plan  Well adult exam - Plan: CBC, Comprehensive metabolic panel, Lipid panel  Elevated blood pressure reading  Need for hepatitis C screening test - Plan: Hepatitis C antibody  Encounter for screening for HIV - Plan: HIV antibody   Well 61 y.o. male. Counseled on diet and exercise. Immunizations, labs, and further orders as above. He will let us know about colonoscopy. Discussed cologard and FIT. Contact insurance co about Shingrix. Check BP at home 2-3 times per week, write down and bring values to appt. Will start Lisinopril vs Norvasc if no improvement Follow up in 4 weeks for BP check. The patient voiced understanding and agreement to the plan.  [place], DO 07/15/16 9:36 AM

## 2016-07-15 NOTE — Patient Instructions (Addendum)
Check with your insurance company about the Shingrix vaccination.   Let us know at any time about scheduling a colonoscopy.  Check blood pressure 2-3 times per week. Write the numbers down. Bring to appointment.  Give [place] 3-4 business days to get the results of your labs back.

## 2016-07-17 ENCOUNTER — Encounter: Payer: Self-pay | Admitting: *Deleted

## 2016-08-19 ENCOUNTER — Ambulatory Visit (INDEPENDENT_AMBULATORY_CARE_PROVIDER_SITE_OTHER): Payer: No Typology Code available for payment source | Admitting: Family Medicine

## 2016-08-19 ENCOUNTER — Encounter: Payer: Self-pay | Admitting: Family Medicine

## 2016-08-19 ENCOUNTER — Inpatient Hospital Stay (HOSPITAL_COMMUNITY)
Admission: RE | Admit: 2016-08-19 | Discharge: 2016-08-21 | DRG: 247 | Disposition: A | Discharge status: Home / Self Care | Payer: No Typology Code available for payment source | Source: Ambulatory Visit | Attending: Interventional Cardiology | Admitting: Interventional Cardiology

## 2016-08-19 ENCOUNTER — Encounter (HOSPITAL_COMMUNITY): Payer: Self-pay | Admitting: General Practice

## 2016-08-19 ENCOUNTER — Ambulatory Visit (INDEPENDENT_AMBULATORY_CARE_PROVIDER_SITE_OTHER): Payer: No Typology Code available for payment source | Admitting: Cardiology

## 2016-08-19 ENCOUNTER — Encounter: Payer: Self-pay | Admitting: Cardiology

## 2016-08-19 VITALS — BP 158/70 | HR 59 | Temp 98.2°F | Ht 68.0 in | Wt 166.6 lb

## 2016-08-19 VITALS — BP 148/82 | HR 60 | Ht 68.0 in | Wt 166.0 lb

## 2016-08-19 DIAGNOSIS — I2510 Atherosclerotic heart disease of native coronary artery without angina pectoris: Secondary | ICD-10-CM

## 2016-08-19 DIAGNOSIS — I200 Unstable angina: Secondary | ICD-10-CM | POA: Insufficient documentation

## 2016-08-19 DIAGNOSIS — R079 Chest pain, unspecified: Secondary | ICD-10-CM

## 2016-08-19 DIAGNOSIS — Z882 Allergy status to sulfonamides status: Secondary | ICD-10-CM

## 2016-08-19 DIAGNOSIS — Z9861 Coronary angioplasty status: Secondary | ICD-10-CM

## 2016-08-19 DIAGNOSIS — Z79899 Other long term (current) drug therapy: Secondary | ICD-10-CM

## 2016-08-19 DIAGNOSIS — I10 Essential (primary) hypertension: Secondary | ICD-10-CM

## 2016-08-19 DIAGNOSIS — I739 Peripheral vascular disease, unspecified: Secondary | ICD-10-CM | POA: Diagnosis present

## 2016-08-19 DIAGNOSIS — I25110 Atherosclerotic heart disease of native coronary artery with unstable angina pectoris: Principal | ICD-10-CM | POA: Diagnosis present

## 2016-08-19 DIAGNOSIS — Z955 Presence of coronary angioplasty implant and graft: Secondary | ICD-10-CM

## 2016-08-19 HISTORY — DX: Pneumonia, unspecified organism: J18.9

## 2016-08-19 HISTORY — DX: Personal history of urinary calculi: Z87.442

## 2016-08-19 HISTORY — DX: Sleep apnea, unspecified: G47.30

## 2016-08-19 HISTORY — DX: Basal cell carcinoma of skin of unspecified parts of face: C44.310

## 2016-08-19 LAB — COMPREHENSIVE METABOLIC PANEL
ALBUMIN: 4 g/dL (ref 3.5–5.0)
ALK PHOS: 50 U/L (ref 38–126)
ALT: 27 U/L (ref 17–63)
AST: 24 U/L (ref 15–41)
Anion gap: 7 (ref 5–15)
BILIRUBIN TOTAL: 0.7 mg/dL (ref 0.3–1.2)
BUN: 15 mg/dL (ref 6–20)
CO2: 26 mmol/L (ref 22–32)
Calcium: 9 mg/dL (ref 8.9–10.3)
Chloride: 104 mmol/L (ref 101–111)
Creatinine, Ser: 1.03 mg/dL (ref 0.61–1.24)
GFR calc Af Amer: >60 mL/min (ref >60)
GFR calc non Af Amer: >60 mL/min (ref >60)
GLUCOSE: 125 mg/dL — AB (ref 65–99)
POTASSIUM: 4 mmol/L (ref 3.5–5.1)
SODIUM: 137 mmol/L (ref 135–145)
TOTAL PROTEIN: 6.1 g/dL — AB (ref 6.5–8.1)

## 2016-08-19 LAB — CBC WITH DIFFERENTIAL/PLATELET
BASOS PCT: 1 %
Basophils Absolute: 0 10*3/uL (ref 0.0–0.1)
Eosinophils Absolute: 0.1 10*3/uL (ref 0.0–0.7)
Eosinophils Relative: 1 %
HEMATOCRIT: 42.3 % (ref 39.0–52.0)
HEMOGLOBIN: 14.8 g/dL (ref 13.0–17.0)
LYMPHS ABS: 1.7 10*3/uL (ref 0.7–4.0)
LYMPHS PCT: 25 %
MCH: 30.1 pg (ref 26.0–34.0)
MCHC: 35 g/dL (ref 30.0–36.0)
MCV: 86.2 fL (ref 78.0–100.0)
MONO ABS: 0.3 10*3/uL (ref 0.1–1.0)
MONOS PCT: 5 %
NEUTROS ABS: 4.8 10*3/uL (ref 1.7–7.7)
Neutrophils Relative %: 68 %
Platelets: 201 10*3/uL (ref 150–400)
RBC: 4.91 MIL/uL (ref 4.22–5.81)
RDW: 12 % (ref 11.5–15.5)
WBC: 7 10*3/uL (ref 4.0–10.5)

## 2016-08-19 LAB — TSH: TSH: 1.423 u[IU]/mL (ref 0.350–4.500)

## 2016-08-19 LAB — MRSA PCR SCREENING: MRSA by PCR: NEGATIVE

## 2016-08-19 LAB — TROPONIN I: Troponin I: <0.03 ng/mL (ref <0.03)

## 2016-08-19 MED ORDER — HEPARIN BOLUS VIA INFUSION
4000.0000 [IU] | Freq: Once | INTRAVENOUS | Status: AC
  Administered 2016-08-19: 4000 [IU] via INTRAVENOUS
  Filled 2016-08-19: qty 4000

## 2016-08-19 MED ORDER — HEPARIN (PORCINE) IN NACL 100-0.45 UNIT/ML-% IJ SOLN
850.0000 [IU]/h | INTRAMUSCULAR | Status: DC
  Administered 2016-08-19: 1000 [IU]/h via INTRAVENOUS
  Filled 2016-08-19: qty 250

## 2016-08-19 MED ORDER — ONDANSETRON HCL 4 MG/2ML IJ SOLN: 4.0000 mg | Freq: Four times a day (QID) | INTRAMUSCULAR | Status: DC | PRN

## 2016-08-19 MED ORDER — METOPROLOL TARTRATE 12.5 MG HALF TABLET
12.5000 mg | ORAL_TABLET | Freq: Two times a day (BID) | ORAL | Status: DC
  Administered 2016-08-19 – 2016-08-21 (×4): 12.5 mg via ORAL
  Filled 2016-08-19 (×4): qty 1

## 2016-08-19 MED ORDER — ASPIRIN EC 81 MG PO TBEC: 81.0000 mg | DELAYED_RELEASE_TABLET | Freq: Every day | ORAL | Status: DC

## 2016-08-19 MED ORDER — AMLODIPINE BESYLATE 5 MG PO TABS: 5.0000 mg | ORAL_TABLET | Freq: Every day | ORAL | 2 refills | Status: DC

## 2016-08-19 MED ORDER — ATORVASTATIN CALCIUM 80 MG PO TABS
80.0000 mg | ORAL_TABLET | Freq: Every day | ORAL | Status: DC
  Administered 2016-08-19 – 2016-08-20 (×2): 80 mg via ORAL
  Filled 2016-08-19 (×2): qty 1

## 2016-08-19 MED ORDER — ACETAMINOPHEN 325 MG PO TABS: 650.0000 mg | ORAL_TABLET | ORAL | Status: DC | PRN

## 2016-08-19 MED ORDER — ASPIRIN 300 MG RE SUPP: 300.0000 mg | RECTAL | Status: AC

## 2016-08-19 MED ORDER — NITROGLYCERIN IN D5W 200-5 MCG/ML-% IV SOLN
0.0000 ug/min | INTRAVENOUS | Status: DC
  Administered 2016-08-19 – 2016-08-20 (×2): 5 ug/min via INTRAVENOUS
  Filled 2016-08-19: qty 250

## 2016-08-19 MED ORDER — ADULT MULTIVITAMIN W/MINERALS CH
1.0000 | ORAL_TABLET | Freq: Every day | ORAL | Status: DC
  Administered 2016-08-20 – 2016-08-21 (×2): 1 via ORAL
  Filled 2016-08-19 (×2): qty 1

## 2016-08-19 MED ORDER — ASPIRIN 81 MG PO CHEW
324.0000 mg | CHEWABLE_TABLET | ORAL | Status: AC
  Administered 2016-08-19: 324 mg via ORAL
  Filled 2016-08-19: qty 4

## 2016-08-19 NOTE — Progress Notes (Signed)
Chief Complaint  Patient presents with  . Follow-up    4 weeks on BP-pt states his BP has continued to stay high    Subjective Ricky Harrison is a 61 y.o. male who presents for hypertension follow up. He does monitor home blood pressures. Blood pressures ranging from 150's/80's on average. He is not currently taking any medicine He is adhering to a healthy diet overall. Current exercise: not currently working out due to exertional chest pain  Exertional chest pain Around 1 mo ago, the pt saw me for a CPE. His chest pain had improved and he had chalked it up to indigestion after normal EKG and labs. Shortly after that visit, he started having a non-radiating, central chest pressure brought on by exercise. It would last for 10-15 minutes after he calmed down. He has not exercised in the past 2 weeks and has not had any issues. He could feel some sensations of it coming on during yard work, but it dissipated when he rested. He has a father who had a MI at age 31. He never smoked. He denies current chest pain, jaw pain, arm pain, SOB, or vision changes.    Past Medical History:  Diagnosis Date  . Cancer ([place]) 2006   basal cell skin   . Inguinal hernia    LEFT-CAUSING SOME DISCOMFORT   . Inguinal hernia, right    Dr. Dalbert Batman  . Kidney stones    Family History  Problem Relation Age of Onset  . Cancer Mother        throat  . Heart attack Father 51     Medications Current Outpatient Prescriptions on File Prior to Visit  Medication Sig Dispense Refill  . Multiple Vitamin (MULTIVITAMIN WITH MINERALS) TABS Take 1 tablet by mouth once a week. Takes on Mondays.     Allergies Allergies  Allergen Reactions  . Sulfonamide Derivatives Rash    At injection site. Made the entire vein that was injected red and spread up the arm.    Review of Systems Cardiovascular: no current chest pain Respiratory:  no shortness of breath  Exam BP (!) 158/70 (BP Location: Left Arm, Patient Position:  Sitting, Cuff Size: Normal)   Pulse (!) 59   Temp 98.2 F (36.8 C) (Oral)   Ht 5\' 8"  (1.727 m)   Wt 166 lb 9.6 oz (75.6 kg)   SpO2 98%   BMI 25.33 kg/m  General:  well developed, well nourished, in no apparent distress Skin:  warm, no pallor or diaphoresis Eyes:  pupils equal and round, sclera anicteric without injection Heart :RRR, no murmurs, no bruits, no LE edema Lungs:  clear to auscultation, no accessory muscle use MSK: Chest wall non-TTP Psych: well oriented with normal range of affect and appropriate judgment/insight  Essential hypertension - Plan: amLODipine (NORVASC) 5 MG tablet  Exertional chest pain - Plan: Ambulatory referral to Cardiology, EKG 12-Lead  Orders as above. EKG shows T wave inversions, discussed case with on-call cardioligst Dr. Stanford Breed. As pt not currently having CP, will have him f/u with him later this afternoon. Office #, address given to pt. He was advised to avoid physical activity and if he gets chest pain before then to go to ER. Will start Norvasc, but he is not to take it until he is seen by Dr. Stanford Breed. F/u in 6 weeks pending visit with Dr. Stanford Breed. The patient voiced understanding and agreement to the plan.  [place], DO 08/19/16  10:07 AM

## 2016-08-19 NOTE — Progress Notes (Signed)
Referring-Ricky Harrison, Ricky Oyster, DO Reason for referral-New onset angina  HPI: 61 year old male for evaluation of new onset angina at the request of Ricky Pal, DO. Patient has had chest pain over the past 6 weeks. The pain is substernal and described as a sledgehammer. There is no radiation. There is no associated symptoms. It occurs with vigorous activity and after 10 minutes resolves with rest. He had the start of pain on Sunday when working in his yard. He also states that he feels unusual today with unusual sensation in his chest. The pain is not clearly present at the time of my evaluation. He was seen by primary care and I was asked to evaluate.   Current Outpatient Prescriptions  Medication Sig Dispense Refill  . amLODipine (NORVASC) 5 MG tablet Take 1 tablet (5 mg total) by mouth daily. 30 tablet 2  . Multiple Vitamin (MULTIVITAMIN WITH MINERALS) TABS Take 1 tablet by mouth once a week. Takes on Mondays.     No current facility-administered medications for this visit.     Allergies  Allergen Reactions  . Sulfonamide Derivatives Rash    At injection site. Made the entire vein that was injected red and spread up the arm.     Past Medical History:  Diagnosis Date  . Cancer ([place]) 2006   basal cell skin   . Hypertension   . Inguinal hernia    LEFT-CAUSING SOME DISCOMFORT   . Inguinal hernia, right    Dr. Dalbert Batman  . Kidney stones     Past Surgical History:  Procedure Laterality Date  . INGUINAL HERNIA REPAIR  01/01/2012   Procedure: LAPAROSCOPIC INGUINAL HERNIA;  Surgeon: Adin Hector, MD;  Location: [facility];  Service: General;  Laterality: Left;  . INSERTION OF MESH  01/01/2012   Procedure: INSERTION OF MESH;  Surgeon: Adin Hector, MD;  Location: [facility];  Service: General;  Laterality: Left;  . LITHOTRIPSY  2008   5 times total - Dr. Risa Grill  . MOHS SURGERY  2006   basal cell - skin  . NASAL SEPTUM SURGERY  2004   Dr. Wilburn Cornelia    Social  History   Social History  . Marital status: Married    Spouse name: N/A  . Number of children: 2  . Years of education: N/A   Occupational History  . Not on file.   Social History Main Topics  . Smoking status: Never Smoker  . Smokeless tobacco: Never Used  . Alcohol use Yes     Comment: Occasional  . Drug use: No  . Sexual activity: Not on file   Other Topics Concern  . Not on file   Social History Narrative  . No narrative on file    Family History  Problem Relation Age of Onset  . Cancer Mother        throat  . Heart attack Father 40    ROS: no fevers or chills, productive cough, hemoptysis, dysphasia, odynophagia, melena, hematochezia, dysuria, hematuria, rash, seizure activity, orthopnea, PND, pedal edema, claudication. Remaining systems are negative.  Physical Exam:   Blood pressure (!) 148/82, pulse 60, height 5\' 8"  (1.727 m), weight 75.3 kg (166 lb).  General:  Well developed/well nourished in NAD Skin warm/dry Patient not depressed No peripheral clubbing Back-normal HEENT-normal/normal eyelids Neck supple/normal carotid upstroke bilaterally; no bruits; no JVD; no thyromegaly chest - CTA/ normal expansion CV - RRR/normal S1 and S2; no murmurs, rubs or gallops;  PMI nondisplaced Abdomen -NT/ND,  no HSM, no mass, + bowel sounds, no bruit 2+ femoral pulses, no bruits Ext-no edema, chords, 2+ DP Neuro-grossly nonfocal  ECG - 06/08/2016-sinus bradycardia with probable early repolarization abnormality. personally reviewed  Today's electrocardiogram shows sinus bradycardia with anterior lateral T-wave inversion new compared to previous. Personally reviewed.  A/P  1 Unstable angina-the patient presents with classic symptoms of unstable angina. His electrocardiogram shows anterior T-wave inversion consistent with an LAD lesion. He had an unusual feeling in his chest at the time of my evaluation that he attributes to stress. He is presently pain-free. I will  admit to telemetry. Cycle enzymes. Treat with aspirin, heparin, statin. I will add low-dose metoprolol at 12.5 mg twice a day. Add IV nitroglycerin if any further chest pain. He will require cardiac catheterization. The risks and benefits including myocardial infarction, CVA and death discussed and he agrees to proceed.   2 newly diagnosed hypertension-we are adding low-dose metoprolol. Follow blood pressure and adjust regimen as needed. I will hold on amlodipine for now.   Ricky Ruths, MD

## 2016-08-19 NOTE — Patient Instructions (Addendum)
Dr. Stanford Breed is the cardiologist you will be seeing today. His office number is [phone]. His office is located at [address], [place], [place], [place] [address]. It is in the [employer], same building as [employer]. Show up around 15 minutes before your appointment, which is scheduled for 2:20 PM. Take the central elevators to the 2nd floor. [facility] is what you are looking for.  Don't take any medicine until you see Dr. Stanford Breed.

## 2016-08-19 NOTE — H&P (Signed)
Lelon Perla, MD Physician Signed Cardiology  Progress Notes Encounter Date: 08/19/2016  Related encounter: Office Visit from 08/19/2016 in [place]       [] Hide copied text [] Hover for attribution information    Referring-Wendling, Crosby Oyster, DO Reason for referral-New onset angina  HPI: 61 year old male for evaluation of new onset angina at the request of [name], DO. Patient has had chest pain over the past 6 weeks. The pain is substernal and described as a sledgehammer. There is no radiation. There is no associated symptoms. It occurs with vigorous activity and after 10 minutes resolves with rest. He had the start of pain on Sunday when working in his yard. He also states that he feels unusual today with unusual sensation in his chest. The pain is not clearly present at the time of my evaluation. He was seen by primary care and I was asked to evaluate.         Current Outpatient Prescriptions  Medication Sig Dispense Refill  . amLODipine (NORVASC) 5 MG tablet Take 1 tablet (5 mg total) by mouth daily. 30 tablet 2  . Multiple Vitamin (MULTIVITAMIN WITH MINERALS) TABS Take 1 tablet by mouth once a week. Takes on Mondays.     No current facility-administered medications for this visit.          Allergies  Allergen Reactions  . Sulfonamide Derivatives Rash    At injection site. Made the entire vein that was injected red and spread up the arm.         Past Medical History:  Diagnosis Date  . Cancer ([place]) 2006   basal cell skin   . Hypertension   . Inguinal hernia    LEFT-CAUSING SOME DISCOMFORT   . Inguinal hernia, right    Dr. Dalbert Batman  . Kidney stones          Past Surgical History:  Procedure Laterality Date  . INGUINAL HERNIA REPAIR  01/01/2012   Procedure: LAPAROSCOPIC INGUINAL HERNIA;  Surgeon: Adin Hector, MD;  Location: [facility];  Service: General;  Laterality: Left;  . INSERTION OF MESH  01/01/2012     Procedure: INSERTION OF MESH;  Surgeon: Adin Hector, MD;  Location: [facility];  Service: General;  Laterality: Left;  . LITHOTRIPSY  2008   5 times total - Dr. Risa Grill  . MOHS SURGERY  2006   basal cell - skin  . NASAL SEPTUM SURGERY  2004   Dr. Wilburn Cornelia    Social History        Social History  . Marital status: Married    Spouse name: N/A  . Number of children: 2  . Years of education: N/A      Occupational History  . Not on file.         Social History Main Topics  . Smoking status: Never Smoker  . Smokeless tobacco: Never Used  . Alcohol use Yes     Comment: Occasional  . Drug use: No  . Sexual activity: Not on file       Other Topics Concern  . Not on file      Social History Narrative  . No narrative on file         Family History  Problem Relation Age of Onset  . Cancer Mother        throat  . Heart attack Father 64    ROS: no fevers or chills, productive cough, hemoptysis, dysphasia, odynophagia, melena, hematochezia, dysuria,  hematuria, rash, seizure activity, orthopnea, PND, pedal edema, claudication. Remaining systems are negative.  Physical Exam:   Blood pressure (!) 148/82, pulse 60, height 5\' 8"  (1.727 m), weight 75.3 kg (166 lb).  General:  Well developed/well nourished in NAD Skin warm/dry Patient not depressed No peripheral clubbing Back-normal HEENT-normal/normal eyelids Neck supple/normal carotid upstroke bilaterally; no bruits; no JVD; no thyromegaly chest - CTA/ normal expansion CV - RRR/normal S1 and S2; no murmurs, rubs or gallops;  PMI nondisplaced Abdomen -NT/ND, no HSM, no mass, + bowel sounds, no bruit 2+ femoral pulses, no bruits Ext-no edema, chords, 2+ DP Neuro-grossly nonfocal  ECG - 06/08/2016-sinus bradycardia with probable early repolarization abnormality. personally reviewed  Today's electrocardiogram shows sinus bradycardia with anterior lateral T-wave inversion new compared to  previous. Personally reviewed.  A/P  1 Unstable angina-the patient presents with classic symptoms of unstable angina. His electrocardiogram shows anterior T-wave inversion consistent with an LAD lesion. He had an unusual feeling in his chest at the time of my evaluation that he attributes to stress. He is presently pain-free. I will admit to telemetry. Cycle enzymes. Treat with aspirin, heparin, statin. I will add low-dose metoprolol at 12.5 mg twice a day. Add IV nitroglycerin if any further chest pain. He will require cardiac catheterization. The risks and benefits including myocardial infarction, CVA and death discussed and he agrees to proceed.   2 newly diagnosed hypertension-we are adding low-dose metoprolol. Follow blood pressure and adjust regimen as needed. I will hold on amlodipine for now.   Kirk Ruths, MD

## 2016-08-19 NOTE — Progress Notes (Signed)
ANTICOAGULATION CONSULT NOTE - Initial Consult  Pharmacy Consult for Heparin Indication: chest pain/ACS  Allergies  Allergen Reactions  . Sulfonamide Derivatives Rash    At injection site. Made the entire vein that was injected red and spread up the arm.    Patient Measurements: Height: 5\' 8"  (172.7 cm) Weight: 163 lb 2.3 oz (74 kg) IBW/kg (Calculated) : 68.4  Vital Signs: Temp: 98.2 F (36.8 C) (07/11 1650) Temp Source: Oral (07/11 1650) BP: 153/93 (07/11 1650) Pulse Rate: 56 (07/11 1700)  Labs: No results for input(s): HGB, HCT, PLT, APTT, LABPROT, INR, HEPARINUNFRC, HEPRLOWMOCWT, CREATININE, CKTOTAL, CKMB, TROPONINI in the last 72 hours.  CrCl cannot be calculated (Patient's most recent lab result is older than the maximum 21 days allowed.).   Medical History: Past Medical History:  Diagnosis Date  . Cancer ([place]) 2006   basal cell skin   . Hypertension   . Inguinal hernia    LEFT-CAUSING SOME DISCOMFORT   . Inguinal hernia, right    Dr. Dalbert Batman  . Kidney stones      Assessment: 61yom admitted with ACS plan to start heparin drip.    Goal of Therapy:  Heparin level 0.3-0.7 units/ml Monitor platelets by anticoagulation protocol: Yes   Plan:  Heparin bolus 4000 uts IV x1 Heparin drip 1000uts/hr  HL in 6hr from start Daily HL, CBC  Bonnita Nasuti Pharm.D. CPP, BCPS Clinical Pharmacist [phone] 08/19/2016 6:32 PM

## 2016-08-20 ENCOUNTER — Other Ambulatory Visit: Payer: Self-pay

## 2016-08-20 ENCOUNTER — Encounter (HOSPITAL_COMMUNITY): Payer: Self-pay | Admitting: Interventional Cardiology

## 2016-08-20 ENCOUNTER — Inpatient Hospital Stay (HOSPITAL_COMMUNITY)
Admission: RE | Disposition: A | Discharge status: Home / Self Care | Payer: Self-pay | Source: Ambulatory Visit | Attending: Interventional Cardiology

## 2016-08-20 DIAGNOSIS — I10 Essential (primary) hypertension: Secondary | ICD-10-CM | POA: Diagnosis not present

## 2016-08-20 DIAGNOSIS — Z882 Allergy status to sulfonamides status: Secondary | ICD-10-CM | POA: Diagnosis not present

## 2016-08-20 DIAGNOSIS — I25110 Atherosclerotic heart disease of native coronary artery with unstable angina pectoris: Secondary | ICD-10-CM | POA: Diagnosis present

## 2016-08-20 DIAGNOSIS — I2582 Chronic total occlusion of coronary artery: Secondary | ICD-10-CM

## 2016-08-20 DIAGNOSIS — I739 Peripheral vascular disease, unspecified: Secondary | ICD-10-CM | POA: Diagnosis present

## 2016-08-20 DIAGNOSIS — I2583 Coronary atherosclerosis due to lipid rich plaque: Secondary | ICD-10-CM | POA: Diagnosis not present

## 2016-08-20 DIAGNOSIS — Z79899 Other long term (current) drug therapy: Secondary | ICD-10-CM | POA: Diagnosis not present

## 2016-08-20 DIAGNOSIS — I200 Unstable angina: Secondary | ICD-10-CM | POA: Diagnosis not present

## 2016-08-20 DIAGNOSIS — I2510 Atherosclerotic heart disease of native coronary artery without angina pectoris: Secondary | ICD-10-CM | POA: Diagnosis not present

## 2016-08-20 DIAGNOSIS — I209 Angina pectoris, unspecified: Secondary | ICD-10-CM | POA: Diagnosis present

## 2016-08-20 HISTORY — PX: CORONARY STENT INTERVENTION: CATH118234

## 2016-08-20 HISTORY — PX: LEFT HEART CATH AND CORONARY ANGIOGRAPHY: CATH118249

## 2016-08-20 LAB — CBC
HCT: 41.1 % (ref 39.0–52.0)
HEMOGLOBIN: 13.8 g/dL (ref 13.0–17.0)
MCH: 29.5 pg (ref 26.0–34.0)
MCHC: 33.6 g/dL (ref 30.0–36.0)
MCV: 87.8 fL (ref 78.0–100.0)
PLATELETS: 190 10*3/uL (ref 150–400)
RBC: 4.68 MIL/uL (ref 4.22–5.81)
RDW: 12.5 % (ref 11.5–15.5)
WBC: 7.9 10*3/uL (ref 4.0–10.5)

## 2016-08-20 LAB — PROTIME-INR
INR: 1.13
PROTHROMBIN TIME: 14.5 s (ref 11.4–15.2)

## 2016-08-20 LAB — LIPID PANEL
CHOLESTEROL: 164 mg/dL (ref 0–200)
HDL: 35 mg/dL — ABNORMAL LOW (ref >40)
LDL CALC: 85 mg/dL (ref 0–99)
TRIGLYCERIDES: 221 mg/dL — AB (ref <150)
Total CHOL/HDL Ratio: 4.7 RATIO
VLDL: 44 mg/dL — AB (ref 0–40)

## 2016-08-20 LAB — HEMOGLOBIN A1C
HEMOGLOBIN A1C: 5.3 % (ref 4.8–5.6)
Mean Plasma Glucose: 105 mg/dL

## 2016-08-20 LAB — HEPARIN LEVEL (UNFRACTIONATED)
Heparin Unfractionated: 0.51 IU/mL (ref 0.30–0.70)
Heparin Unfractionated: 0.85 IU/mL — ABNORMAL HIGH (ref 0.30–0.70)

## 2016-08-20 LAB — TROPONIN I: Troponin I: <0.03 ng/mL (ref <0.03)

## 2016-08-20 LAB — POCT ACTIVATED CLOTTING TIME: ACTIVATED CLOTTING TIME: 351 s

## 2016-08-20 SURGERY — LEFT HEART CATH AND CORONARY ANGIOGRAPHY
Anesthesia: LOCAL

## 2016-08-20 MED ORDER — SODIUM CHLORIDE 0.9% FLUSH: 3.0000 mL | Freq: Two times a day (BID) | INTRAVENOUS | Status: DC

## 2016-08-20 MED ORDER — SODIUM CHLORIDE 0.9 % IV SOLN: 250.0000 mL | INTRAVENOUS | Status: DC | PRN

## 2016-08-20 MED ORDER — SODIUM CHLORIDE 0.9% FLUSH: 3.0000 mL | INTRAVENOUS | Status: DC | PRN

## 2016-08-20 MED ORDER — HEPARIN SODIUM (PORCINE) 1000 UNIT/ML IJ SOLN
INTRAMUSCULAR | Status: AC
  Filled 2016-08-20: qty 1

## 2016-08-20 MED ORDER — MIDAZOLAM HCL 2 MG/2ML IJ SOLN
INTRAMUSCULAR | Status: DC | PRN
  Administered 2016-08-20: 2 mg via INTRAVENOUS
  Administered 2016-08-20 (×3): 1 mg via INTRAVENOUS

## 2016-08-20 MED ORDER — LIDOCAINE HCL (PF) 1 % IJ SOLN
INTRAMUSCULAR | Status: DC | PRN
  Administered 2016-08-20: 15 mL
  Administered 2016-08-20: 2 mL

## 2016-08-20 MED ORDER — BIVALIRUDIN TRIFLUOROACETATE 250 MG IV SOLR
INTRAVENOUS | Status: AC
  Filled 2016-08-20: qty 250

## 2016-08-20 MED ORDER — SODIUM CHLORIDE 0.9 % WEIGHT BASED INFUSION
1.0000 mL/kg/h | INTRAVENOUS | Status: DC
  Administered 2016-08-20: 1 mL/kg/h via INTRAVENOUS

## 2016-08-20 MED ORDER — IOPAMIDOL (ISOVUE-370) INJECTION 76%
INTRAVENOUS | Status: DC | PRN
  Administered 2016-08-20: 180 mL via INTRA_ARTERIAL

## 2016-08-20 MED ORDER — IOPAMIDOL (ISOVUE-370) INJECTION 76%
INTRAVENOUS | Status: AC
  Filled 2016-08-20: qty 100

## 2016-08-20 MED ORDER — ASPIRIN 81 MG PO CHEW
81.0000 mg | CHEWABLE_TABLET | ORAL | Status: AC
  Administered 2016-08-20: 81 mg via ORAL
  Filled 2016-08-20: qty 1

## 2016-08-20 MED ORDER — VERAPAMIL HCL 2.5 MG/ML IV SOLN
INTRAVENOUS | Status: AC
  Filled 2016-08-20: qty 2

## 2016-08-20 MED ORDER — SODIUM CHLORIDE 0.9 % WEIGHT BASED INFUSION
3.0000 mL/kg/h | INTRAVENOUS | Status: DC
  Administered 2016-08-20: 3 mL/kg/h via INTRAVENOUS

## 2016-08-20 MED ORDER — ONDANSETRON HCL 4 MG/2ML IJ SOLN: 4.0000 mg | Freq: Four times a day (QID) | INTRAMUSCULAR | Status: DC | PRN

## 2016-08-20 MED ORDER — SODIUM CHLORIDE 0.9% FLUSH
3.0000 mL | Freq: Two times a day (BID) | INTRAVENOUS | Status: DC
  Administered 2016-08-20 (×2): 3 mL via INTRAVENOUS

## 2016-08-20 MED ORDER — NITROGLYCERIN 1 MG/10 ML FOR IR/CATH LAB
INTRA_ARTERIAL | Status: DC | PRN
  Administered 2016-08-20: 200 ug via INTRACORONARY

## 2016-08-20 MED ORDER — HEPARIN (PORCINE) IN NACL 2-0.9 UNIT/ML-% IJ SOLN
INTRAMUSCULAR | Status: AC
  Filled 2016-08-20: qty 1000

## 2016-08-20 MED ORDER — MIDAZOLAM HCL 2 MG/2ML IJ SOLN
INTRAMUSCULAR | Status: AC
  Filled 2016-08-20: qty 2

## 2016-08-20 MED ORDER — HEPARIN (PORCINE) IN NACL 2-0.9 UNIT/ML-% IJ SOLN
INTRAMUSCULAR | Status: AC | PRN
  Administered 2016-08-20: 1000 mL
  Administered 2016-08-20: 500 mL

## 2016-08-20 MED ORDER — TICAGRELOR 90 MG PO TABS
ORAL_TABLET | ORAL | Status: DC | PRN
  Administered 2016-08-20: 180 mg via ORAL

## 2016-08-20 MED ORDER — NITROGLYCERIN 1 MG/10 ML FOR IR/CATH LAB
INTRA_ARTERIAL | Status: AC
  Filled 2016-08-20: qty 10

## 2016-08-20 MED ORDER — SODIUM CHLORIDE 0.9 % IV SOLN
INTRAVENOUS | Status: DC
  Administered 2016-08-20: 11:00:00 via INTRAVENOUS

## 2016-08-20 MED ORDER — FENTANYL CITRATE (PF) 100 MCG/2ML IJ SOLN
INTRAMUSCULAR | Status: DC | PRN
  Administered 2016-08-20 (×4): 25 ug via INTRAVENOUS

## 2016-08-20 MED ORDER — TICAGRELOR 90 MG PO TABS
ORAL_TABLET | ORAL | Status: AC
  Filled 2016-08-20: qty 1

## 2016-08-20 MED ORDER — ACETAMINOPHEN 325 MG PO TABS: 650.0000 mg | ORAL_TABLET | ORAL | Status: DC | PRN

## 2016-08-20 MED ORDER — BIVALIRUDIN BOLUS VIA INFUSION - CUPID
INTRAVENOUS | Status: DC | PRN
  Administered 2016-08-20: 54.525 mg via INTRAVENOUS

## 2016-08-20 MED ORDER — ASPIRIN 81 MG PO CHEW
81.0000 mg | CHEWABLE_TABLET | Freq: Every day | ORAL | Status: DC
  Administered 2016-08-21: 81 mg via ORAL
  Filled 2016-08-20: qty 1

## 2016-08-20 MED ORDER — TICAGRELOR 90 MG PO TABS
90.0000 mg | ORAL_TABLET | Freq: Two times a day (BID) | ORAL | Status: DC
  Administered 2016-08-20 – 2016-08-21 (×2): 90 mg via ORAL
  Filled 2016-08-20 (×2): qty 1

## 2016-08-20 MED ORDER — SODIUM CHLORIDE 0.9 % IV SOLN
INTRAVENOUS | Status: AC | PRN
  Administered 2016-08-20 (×2): 1.75 mg/kg/h via INTRAVENOUS

## 2016-08-20 MED ORDER — FENTANYL CITRATE (PF) 100 MCG/2ML IJ SOLN
INTRAMUSCULAR | Status: AC
  Filled 2016-08-20: qty 2

## 2016-08-20 MED ORDER — HYDRALAZINE HCL 20 MG/ML IJ SOLN: 5.0000 mg | INTRAMUSCULAR | Status: DC | PRN

## 2016-08-20 MED ORDER — VERAPAMIL HCL 2.5 MG/ML IV SOLN
INTRAVENOUS | Status: DC | PRN
  Administered 2016-08-20: 08:00:00 via INTRA_ARTERIAL
  Administered 2016-08-20: 10 mL via INTRA_ARTERIAL

## 2016-08-20 MED ORDER — LABETALOL HCL 5 MG/ML IV SOLN: 10.0000 mg | INTRAVENOUS | Status: DC | PRN

## 2016-08-20 SURGICAL SUPPLY — 28 items
BALLN EUPHORA RX 2.5X15 (BALLOONS) ×2
BALLN SPRINTER MX OTW 1.5X12 (BALLOONS) ×2
BALLN ~~LOC~~ EUPHORA RX 3.0X12 (BALLOONS) ×2
BALLOON EUPHORA RX 2.5X15 (BALLOONS) IMPLANT
BALLOON SPRINTER MX OTW 1.5X12 (BALLOONS) IMPLANT
BALLOON ~~LOC~~ EUPHORA RX 3.0X12 (BALLOONS) IMPLANT
CATH 5FR JL3.5 JR4 ANG PIG MP (CATHETERS) ×1 IMPLANT
CATH INFINITI 4FR JL3.5 (CATHETERS) ×1 IMPLANT
CATH LAUNCHER 6FR EBU3.5 (CATHETERS) ×1 IMPLANT
COVER PRB 48X5XTLSCP FOLD TPE (BAG) IMPLANT
COVER PROBE 5X48 (BAG) ×4
DEVICE RAD COMP TR BAND LRG (VASCULAR PRODUCTS) ×1 IMPLANT
GLIDESHEATH SLEND SS 6F .021 (SHEATH) ×1 IMPLANT
GUIDEWIRE INQWIRE 1.5J.035X260 (WIRE) IMPLANT
INQWIRE 1.5J .035X260CM (WIRE) ×2
KIT ENCORE 26 ADVANTAGE (KITS) ×1 IMPLANT
KIT HEART LEFT (KITS) ×2 IMPLANT
PACK CARDIAC CATHETERIZATION (CUSTOM PROCEDURE TRAY) ×2 IMPLANT
SHEATH PINNACLE 6F 10CM (SHEATH) ×1 IMPLANT
STENT SYNERGY DES 2.5X38 (Permanent Stent) ×1 IMPLANT
TRANSDUCER W/STOPCOCK (MISCELLANEOUS) ×2 IMPLANT
TUBING CIL FLEX 10 FLL-RA (TUBING) ×2 IMPLANT
VALVE GUARDIAN II ~~LOC~~ HEMO (MISCELLANEOUS) ×1 IMPLANT
WIRE ASAHI MIRACLEBROS-3 180CM (WIRE) ×1 IMPLANT
WIRE ASAHI PROWATER 180CM (WIRE) ×1 IMPLANT
WIRE ASAHI PROWATER 300CM (WIRE) ×1 IMPLANT
WIRE HI TORQ BMW 190CM (WIRE) ×1 IMPLANT
WIRE HI TORQ VERSACORE-J 145CM (WIRE) ×1 IMPLANT

## 2016-08-20 NOTE — Progress Notes (Signed)
ANTICOAGULATION CONSULT NOTE - Initial Consult  Pharmacy Consult for Heparin Indication: chest pain/ACS  Allergies  Allergen Reactions  . Sulfonamide Derivatives Rash    At injection site. Made the entire vein that was injected red and spread up the arm.    Patient Measurements: Height: 5\' 8"  (172.7 cm) Weight: 163 lb 2.3 oz (74 kg) IBW/kg (Calculated) : 68.4  Assessment: 61yom admitted with ACS plan to start heparin drip.  First heparin level is high at 0.85. No issued documented. CBC wnl.  Goal of Therapy:  Heparin level 0.3-0.7 units/ml Monitor platelets by anticoagulation protocol: Yes   Plan:  Decrease heparin gtt to 850 units/hr Check 6 hr heparin level Monitor daily heparin level, CBC, s/s of bleed  Elenor Quinones, PharmD, BCPS Clinical Pharmacist Pager [phone] 08/20/2016 12:22 AM

## 2016-08-20 NOTE — Interval H&P Note (Signed)
Cath Lab Visit (complete for each Cath Lab visit)  Clinical Evaluation Leading to the Procedure:   ACS: Yes.    Non-ACS:    Anginal Classification: CCS IV  Anti-ischemic medical therapy: Minimal Therapy (1 class of medications)  Non-Invasive Test Results: No non-invasive testing performed  Prior CABG: No previous CABG      History and Physical Interval Note:  08/20/2016 7:33 AM  Ricky Harrison  has presented today for surgery, with the diagnosis of unstable angina  The various methods of treatment have been discussed with the patient and family. After consideration of risks, benefits and other options for treatment, the patient has consented to  Procedure(s): Left Heart Cath and Coronary Angiography (N/A) as a surgical intervention .  The patient's history has been reviewed, patient examined, no change in status, stable for surgery.  I have reviewed the patient's chart and labs.  Questions were answered to the patient's satisfaction.     Ricky Harrison

## 2016-08-20 NOTE — Care Management Note (Addendum)
Case Management Note  Patient Details  Name: Ricky Harrison MRN: [id number] Date of Birth: 1955-10-29  Subjective/Objective:    From home  With wife, pta indep, s/p coronary stent intervention, will be on brilinta, NCM awaiting benefit check . Co pay is 355.70 deductible not met, daughter is picking up samples from Cardiology office.  NCM spoke with Mindy and she will have them at the front desk.  Patient is for dc today.                Action/Plan: NCM will follow for dc needs.   Expected Discharge Date:                  Expected Discharge Plan:  Home/Self Care  In-House Referral:     Discharge planning Services  CM Consult  Post Acute Care Choice:    Choice offered to:     DME Arranged:    DME Agency:     HH Arranged:    HH Agency:     Status of Service:  In process, will continue to follow  If discussed at Long Length of Stay Meetings, dates discussed:    Additional Comments:  Zenon Mayo, RN 08/20/2016, 4:26 PM

## 2016-08-21 ENCOUNTER — Other Ambulatory Visit: Payer: Self-pay

## 2016-08-21 ENCOUNTER — Telehealth: Payer: Self-pay | Admitting: Interventional Cardiology

## 2016-08-21 DIAGNOSIS — I2510 Atherosclerotic heart disease of native coronary artery without angina pectoris: Secondary | ICD-10-CM

## 2016-08-21 DIAGNOSIS — I200 Unstable angina: Secondary | ICD-10-CM

## 2016-08-21 DIAGNOSIS — I10 Essential (primary) hypertension: Secondary | ICD-10-CM

## 2016-08-21 DIAGNOSIS — Z9861 Coronary angioplasty status: Secondary | ICD-10-CM

## 2016-08-21 DIAGNOSIS — I2583 Coronary atherosclerosis due to lipid rich plaque: Secondary | ICD-10-CM

## 2016-08-21 LAB — CBC
HCT: 43.1 % (ref 39.0–52.0)
Hemoglobin: 14.8 g/dL (ref 13.0–17.0)
MCH: 29.8 pg (ref 26.0–34.0)
MCHC: 34.3 g/dL (ref 30.0–36.0)
MCV: 86.7 fL (ref 78.0–100.0)
Platelets: 174 K/uL (ref 150–400)
RBC: 4.97 MIL/uL (ref 4.22–5.81)
RDW: 12.4 % (ref 11.5–15.5)
WBC: 8.8 K/uL (ref 4.0–10.5)

## 2016-08-21 LAB — BASIC METABOLIC PANEL WITH GFR
Anion gap: 7 (ref 5–15)
BUN: 7 mg/dL (ref 6–20)
CO2: 24 mmol/L (ref 22–32)
Calcium: 9.1 mg/dL (ref 8.9–10.3)
Chloride: 107 mmol/L (ref 101–111)
Creatinine, Ser: 0.97 mg/dL (ref 0.61–1.24)
GFR calc Af Amer: >60 mL/min (ref >60)
GFR calc non Af Amer: >60 mL/min (ref >60)
Glucose, Bld: 104 mg/dL — ABNORMAL HIGH (ref 65–99)
Potassium: 4 mmol/L (ref 3.5–5.1)
Sodium: 138 mmol/L (ref 135–145)

## 2016-08-21 MED ORDER — ATORVASTATIN CALCIUM 80 MG PO TABS: 80.0000 mg | ORAL_TABLET | Freq: Every day | ORAL | 5 refills | Status: DC

## 2016-08-21 MED ORDER — METOPROLOL TARTRATE 25 MG PO TABS: 12.5000 mg | ORAL_TABLET | Freq: Two times a day (BID) | ORAL | 5 refills | Status: DC

## 2016-08-21 MED ORDER — LISINOPRIL 5 MG PO TABS
5.0000 mg | ORAL_TABLET | Freq: Every day | ORAL | Status: DC
  Administered 2016-08-21: 5 mg via ORAL
  Filled 2016-08-21: qty 1

## 2016-08-21 MED ORDER — ASPIRIN 81 MG PO CHEW: 81.0000 mg | CHEWABLE_TABLET | Freq: Every day | ORAL | Status: DC

## 2016-08-21 MED ORDER — LISINOPRIL 5 MG PO TABS: 5.0000 mg | ORAL_TABLET | Freq: Every day | ORAL | 5 refills | Status: DC

## 2016-08-21 MED ORDER — NITROGLYCERIN 0.4 MG SL SUBL: 0.4000 mg | SUBLINGUAL_TABLET | SUBLINGUAL | 2 refills | Status: DC | PRN

## 2016-08-21 MED ORDER — TICAGRELOR 90 MG PO TABS: 90.0000 mg | ORAL_TABLET | Freq: Two times a day (BID) | ORAL | 0 refills | Status: DC

## 2016-08-21 MED ORDER — NITROGLYCERIN IN D5W 200-5 MCG/ML-% IV SOLN: 0.0000 ug/min | INTRAVENOUS | Status: DC

## 2016-08-21 MED ORDER — ANGIOPLASTY BOOK
Freq: Once | Status: AC
  Administered 2016-08-21: 1
  Filled 2016-08-21: qty 1

## 2016-08-21 MED ORDER — TICAGRELOR 90 MG PO TABS: 90.0000 mg | ORAL_TABLET | Freq: Two times a day (BID) | ORAL | 3 refills | Status: DC

## 2016-08-21 MED FILL — Heparin Sodium (Porcine) Inj 1000 Unit/ML: INTRAMUSCULAR | Qty: 10 | Status: AC

## 2016-08-21 NOTE — Progress Notes (Signed)
CARDIAC REHAB PHASE I   PRE:  Rate/Rhythm: 77 SR  BP:  Supine: 134/74  Sitting:   Standing:    SaO2:   MODE:  Ambulation: 800 ft   POST:  Rate/Rhythm: 74 SR  BP:  Supine: 173/84  Sitting:   Standing:    SaO2:  0810-0910 Pt walked 800 ft with steady gait and no CP. Tolerated well. Education completed with pt and his wife and daughter who voiced understanding. Stressed importance of brilinta with stent. Needs to see case manager. Reviewed NTG use, risk factors, ex ed, heart healthy food choices and CRP 2. Referring to [employer] program since pt works in [employer].   Graylon Good, RN BSN  08/21/2016 9:04 AM

## 2016-08-21 NOTE — Progress Notes (Signed)
S/W JACOB @ [facility] # [phone]   BRILINTA 90 MG BID   COVER- YES  CO-PAY- $ 355.70  DEDUCTIBLE NOT MET  PRIOR APPROVAL- YES # [phone]    PREFERRED PHARMACY : [facility] [place], [facility] AND [facility]

## 2016-08-21 NOTE — Discharge Summary (Signed)
Discharge Summary    Patient ID: Ricky Harrison,  MRN: [id number], DOB/AGE: [birth year] 61 y.o.  Admit date: 08/19/2016 Discharge date: 08/21/2016  Primary Care Provider: [name] Primary Cardiologist: Dr. Stanford Breed  Discharge Diagnoses    Active Problems:   CAD S/P percutaneous coronary angioplasty- PCI +DES to prox LAD   Allergies Allergies  Allergen Reactions  . Sulfonamide Derivatives Rash    At injection site. Made the entire vein that was injected red and spread up the arm.    Diagnostic Studies/Procedures    Procedures   Coronary Stent Intervention  Left Heart Cath and Coronary Angiography  Conclusion     There is mild left ventricular systolic dysfunction.  The left ventricular ejection fraction is 45-50% by visual estimate.  LV end diastolic pressure is normal.  There is no aortic valve stenosis.  Prox LAD lesion, 100 %stenosed. Chronic total occlusion.  A STENT SYNERGY DES 2.5X38 drug eluting stent was successfully placed.  Post intervention, there is a 0% residual stenosis.   Continue dual antiplatelet therapy for at least a year. Continue aggressive secondary prevention. Patient was loaded with Brilinta in the Cath Lab.      History of Present Illness     61 y/o male, with h/o HTN, recently referred to Dr. Stanford Breed, by PCP, for evaluation given development of unstable angina. EKG was notable for T-wave inversions consistent with an LAD lesion. Given symptoms and abnormal EKG, Dr. Stanford Breed referred him for definitive LHC.  Hospital Course   Pt presented to [facility] on [facility]16 for the planned procedure. LHC was performed by Dr. Irish Lack. Access obtained via the right radial artery. He was found to have single vessel CAD with 100% CTO proximal LAD. EF 45-50%. He underwent successful PCI + DES to the proximal LAD. He tolerated the procedure well and left the cath lab in stable condition. He was placed on DAPT with ASA and Brilinta, high  dose statin therapy with Lipitor, BB therapy with metoprolol and ACE-I therapy with lisinopril. His amlodipine was discontinued. He was monitored overnight and had no post cath complications. He ambulated with cardiac rehab w/o exertional chest pain or dyspnea. Vital signs and renal function remained stable as well as his right radial cath. He was last seen and examined by Dr. Marlou Porch, who determined he was stable for discharge home. He will f/u with Dr. Stanford Breed or APP in 1-2 weeks.   Consultants: none    Discharge Vitals Blood pressure (!) 163/68, pulse 74, temperature 98.3 F (36.8 C), temperature source Oral, resp. rate 13, height 5\' 8"  (1.727 m), weight 160 lb 4.8 oz (72.7 kg), SpO2 98 %.  Filed Weights   08/19/16 1700 08/20/16 0500  Weight: 163 lb 2.3 oz (74 kg) 160 lb 4.8 oz (72.7 kg)    Labs & Radiologic Studies    CBC  Recent Labs  08/19/16 1834 08/20/16 0600 08/21/16 0339  WBC 7.0 7.9 8.8  NEUTROABS 4.8  --   --   HGB 14.8 13.8 14.8  HCT 42.3 41.1 43.1  MCV 86.2 87.8 86.7  PLT 201 190 283   Basic Metabolic Panel  Recent Labs  08/19/16 1834 08/21/16 0339  NA 137 138  K 4.0 4.0  CL 104 107  CO2 26 24  GLUCOSE 125* 104*  BUN 15 7  CREATININE 1.03 0.97  CALCIUM 9.0 9.1   Liver Function Tests  Recent Labs  08/19/16 1834  AST 24  ALT 27  ALKPHOS 50  BILITOT 0.7  PROT 6.1*  ALBUMIN 4.0   No results for input(s): LIPASE, AMYLASE in the last 72 hours. Cardiac Enzymes  Recent Labs  08/19/16 1834 08/19/16 2338 08/20/16 0600  TROPONINI <0.03 <0.03 <0.03   BNP Invalid input(s): POCBNP D-Dimer No results for input(s): DDIMER in the last 72 hours. Hemoglobin A1C  Recent Labs  08/19/16 1834  HGBA1C 5.3   Fasting Lipid Panel  Recent Labs  08/20/16 0600  CHOL 164  HDL 35*  LDLCALC 85  TRIG 221*  CHOLHDL 4.7   Thyroid Function Tests  Recent Labs  08/19/16 1834  TSH 1.423   _____________  No results found. Disposition   Pt is  being discharged home today in good condition.  Follow-up Plans & Appointments    Follow-up Information    Lelon Perla, MD Follow up.   Specialty:  Cardiology Why:  our office will call you with a post hospital follow-up appt.  Contact information: [address] [place] [address] [phone]          Discharge Instructions    Amb Referral to Cardiac Rehabilitation    Complete by:  As directed    Diagnosis:  Coronary Stents   Diet - low sodium heart healthy    Complete by:  As directed    Increase activity slowly    Complete by:  As directed       Discharge Medications   Discharge Medication List as of 08/21/2016 10:00 AM    START taking these medications   Details  aspirin 81 MG chewable tablet Chew 1 tablet (81 mg total) by mouth daily., Starting Sat 08/22/2016, No Print    atorvastatin (LIPITOR) 80 MG tablet Take 1 tablet (80 mg total) by mouth daily at 6 PM., Starting Fri 08/21/2016, Normal    lisinopril (PRINIVIL,ZESTRIL) 5 MG tablet Take 1 tablet (5 mg total) by mouth daily., Starting Sat 08/22/2016, Normal    metoprolol tartrate (LOPRESSOR) 25 MG tablet Take 0.5 tablets (12.5 mg total) by mouth 2 (two) times daily., Starting Fri 08/21/2016, Normal    nitroGLYCERIN (NITROSTAT) 0.4 MG SL tablet Place 1 tablet (0.4 mg total) under the tongue every 5 (five) minutes as needed for chest pain., Starting Fri 08/21/2016, Normal    !! ticagrelor (BRILINTA) 90 MG TABS tablet Take 1 tablet (90 mg total) by mouth 2 (two) times daily., Starting Fri 08/21/2016, Normal    !! ticagrelor (BRILINTA) 90 MG TABS tablet Take 1 tablet (90 mg total) by mouth 2 (two) times daily., Starting Fri 08/21/2016, Normal     !! - Potential duplicate medications found. Please discuss with provider.    CONTINUE these medications which have NOT CHANGED   Details  Multiple Vitamin (MULTIVITAMIN WITH MINERALS) TABS Take 1 tablet by mouth daily. Takes on Mondays., Historical Med       STOP taking these medications     amLODipine (NORVASC) 5 MG tablet          Aspirin prescribed at discharge?  Yes High Intensity Statin Prescribed? (Lipitor 40-80mg  or Crestor 20-40mg ): Yes Beta Blocker Prescribed? Yes For EF <40%, was ACEI/ARB Prescribed? Yes ADP Receptor Inhibitor Prescribed? (i.e. Plavix etc.-Includes Medically Managed Patients): Yes For EF <40%, Aldosterone Inhibitor Prescribed? No: EF >40% Was EF assessed during THIS hospitalization? Yes Was Cardiac Rehab II ordered? (Included Medically managed Patients): Yes   Outstanding Labs/Studies   F/u BMP at outpatient f/u (lisinopril added)  Duration of Discharge Encounter   Greater than 30  minutes including physician time.  Signed, Lyda Jester PA-C 08/21/2016, 10:46 AM  Personally seen and examined. Agree with above. Progress Note  Patient Name: Ricky Harrison Date of Encounter: 08/21/2016  Primary Cardiologist: Stanford Breed  Subjective   Feels well, no cp, no wrist pain, no sob  Inpatient Medications    Scheduled Meds: . aspirin  81 mg Oral Daily  . atorvastatin  80 mg Oral q1800  . metoprolol tartrate  12.5 mg Oral BID  . multivitamin with minerals  1 tablet Oral Daily  . sodium chloride flush  3 mL Intravenous Q12H  . ticagrelor  90 mg Oral BID   Continuous Infusions: . sodium chloride Stopped (08/20/16 2223)  . nitroGLYCERIN 1.3 mcg/min (08/21/16 0600)   PRN Meds: sodium chloride, acetaminophen, ondansetron (ZOFRAN) IV, sodium chloride flush   Vital Signs          Vitals:   08/21/16 0150 08/21/16 0400 08/21/16 0600 08/21/16 0735  BP: (!) 170/80 (!) 149/84 (!) 142/90 (!) 163/68  Pulse: 60 (!) 55 60 74  Resp: (!) 28 14 14 14   Temp: 98.3 F (36.8 C) 98.5 F (36.9 C)  98.3 F (36.8 C)  TempSrc: Oral Oral  Oral  SpO2: 95% 97% 97% 98%  Weight:      Height:        Intake/Output Summary (Last 24 hours) at 08/21/16 0750 Last data filed at 08/21/16 0736   Gross per 24 hour  Intake          1685.33 ml  Output             1725 ml  Net           -39.67 ml       Filed Weights   08/19/16 1700 08/20/16 0500  Weight: 163 lb 2.3 oz (74 kg) 160 lb 4.8 oz (72.7 kg)    Telemetry    Rare PVC, couplet, NSR - Personally Reviewed  ECG    NSR with TWI precordial - Personally Reviewed  Physical Exam   GEN:No acute distress.   Neck:No JVD Cardiac:RRR, no murmurs, rubs, or gallops.  Respiratory:Clear to auscultation bilaterally. ES:PQZR, nontender, non-distended  MS:No edema; No deformity. Neuro:Nonfocal  Psych: Normal affect   Labs    Chemistry Last Labs    Recent Labs Lab 08/19/16 1834 08/21/16 0339  NA 137 138  K 4.0 4.0  CL 104 107  CO2 26 24  GLUCOSE 125* 104*  BUN 15 7  CREATININE 1.03 0.97  CALCIUM 9.0 9.1  PROT 6.1*  --   ALBUMIN 4.0  --   AST 24  --   ALT 27  --   ALKPHOS 50  --   BILITOT 0.7  --   GFRNONAA >60 >60  GFRAA >60 >60  ANIONGAP 7 7       Hematology Last Labs    Recent Labs Lab 08/19/16 1834 08/20/16 0600 08/21/16 0339  WBC 7.0 7.9 8.8  RBC 4.91 4.68 4.97  HGB 14.8 13.8 14.8  HCT 42.3 41.1 43.1  MCV 86.2 87.8 86.7  MCH 30.1 29.5 29.8  MCHC 35.0 33.6 34.3  RDW 12.0 12.5 12.4  PLT 201 190 174      Cardiac Enzymes Last Labs    Recent Labs Lab 08/19/16 1834 08/19/16 2338 08/20/16 0600  TROPONINI <0.03 <0.03 <0.03      Last Labs   No results for input(s): TROPIPOC in the last 168 hours.     BNP Last Labs  No results for input(s): BNP, PROBNP in the last 168 hours.     DDimer  Last Labs   No results for input(s): DDIMER in the last 168 hours.     Radiology    Imaging Results (Last 48 hours)  No results found.    Cardiac Studies   Cath   There is mild left ventricular systolic dysfunction.  The left ventricular ejection fraction is 45-50% by visual estimate.  LV end diastolic pressure is normal.  There is no aortic valve  stenosis.  Prox LAD lesion, 100 %stenosed. Chronic total occlusion.  A STENT SYNERGY DES 2.5X38 drug eluting stent was successfully placed.  Post intervention, there is a 0% residual stenosis.  Continue dual antiplatelet therapy for at least a year. Continue aggressive secondary prevention. Patient was loaded with Brilinta in the Cath Lab.  There is mild the vessel.  Wall Motion              Left Heart   Left Ventricle The left ventricle is mildly dilated. There is mild left ventricular systolic dysfunction. LV end diastolic pressure is normal. The left ventricular ejection fraction is 45-50% by visual estimate. There are LV function abnormalities due to segmental dysfunction.    Aortic Valve There is no aortic valve stenosis.    Coronary Diagrams   Diagnostic Diagram       Post-Intervention Diagram         Patient Profile     61 y.o. male with [place], CAD post LAD DES with EF 45%  Assessment & Plan    [place]/CAD  - post LAD prox DES  - DAPT one year  - statin, asa, bb, ace-I  - cardiac rehab  Mildly reduced EF 45%  - add lisinopril 5mg  PO QD  - continue bb  - may improve post stent  Essential HTN  - Bb new  - Lisinopril new  - Stopped amlodipine (was recently started)  - monitor  - mildly elevated this AM prior to meds  OK for DC home TOC follow up in 1-2 weeks.   Signed, Candee Furbish, MD

## 2016-08-21 NOTE — Progress Notes (Signed)
Progress Note  Patient Name: Ricky Harrison Date of Encounter: 08/21/2016  Primary Cardiologist: Stanford Breed  Subjective   Feels well, no cp, no wrist pain, no sob  Inpatient Medications    Scheduled Meds: . aspirin  81 mg Oral Daily  . atorvastatin  80 mg Oral q1800  . metoprolol tartrate  12.5 mg Oral BID  . multivitamin with minerals  1 tablet Oral Daily  . sodium chloride flush  3 mL Intravenous Q12H  . ticagrelor  90 mg Oral BID   Continuous Infusions: . sodium chloride Stopped (08/20/16 2223)  . nitroGLYCERIN 1.3 mcg/min (08/21/16 0600)   PRN Meds: sodium chloride, acetaminophen, ondansetron (ZOFRAN) IV, sodium chloride flush   Vital Signs    Vitals:   08/21/16 0150 08/21/16 0400 08/21/16 0600 08/21/16 0735  BP: (!) 170/80 (!) 149/84 (!) 142/90 (!) 163/68  Pulse: 60 (!) 55 60 74  Resp: (!) 28 14 14 14   Temp: 98.3 F (36.8 C) 98.5 F (36.9 C)  98.3 F (36.8 C)  TempSrc: Oral Oral  Oral  SpO2: 95% 97% 97% 98%  Weight:      Height:        Intake/Output Summary (Last 24 hours) at 08/21/16 0750 Last data filed at 08/21/16 0736  Gross per 24 hour  Intake          1685.33 ml  Output             1725 ml  Net           -39.67 ml   Filed Weights   08/19/16 1700 08/20/16 0500  Weight: 163 lb 2.3 oz (74 kg) 160 lb 4.8 oz (72.7 kg)    Telemetry    Rare PVC, couplet, NSR - Personally Reviewed  ECG    NSR with TWI precordial - Personally Reviewed  Physical Exam   GEN: No acute distress.   Neck: No JVD Cardiac: RRR, no murmurs, rubs, or gallops.  Respiratory: Clear to auscultation bilaterally. GI: Soft, nontender, non-distended  MS: No edema; No deformity. Neuro:  Nonfocal  Psych: Normal affect   Labs    Chemistry Recent Labs Lab 08/19/16 1834 08/21/16 0339  NA 137 138  K 4.0 4.0  CL 104 107  CO2 26 24  GLUCOSE 125* 104*  BUN 15 7  CREATININE 1.03 0.97  CALCIUM 9.0 9.1  PROT 6.1*  --   ALBUMIN 4.0  --   AST 24  --   ALT 27  --     ALKPHOS 50  --   BILITOT 0.7  --   GFRNONAA >60 >60  GFRAA >60 >60  ANIONGAP 7 7     Hematology Recent Labs Lab 08/19/16 1834 08/20/16 0600 08/21/16 0339  WBC 7.0 7.9 8.8  RBC 4.91 4.68 4.97  HGB 14.8 13.8 14.8  HCT 42.3 41.1 43.1  MCV 86.2 87.8 86.7  MCH 30.1 29.5 29.8  MCHC 35.0 33.6 34.3  RDW 12.0 12.5 12.4  PLT 201 190 174    Cardiac Enzymes Recent Labs Lab 08/19/16 1834 08/19/16 2338 08/20/16 0600  TROPONINI <0.03 <0.03 <0.03   No results for input(s): TROPIPOC in the last 168 hours.   BNPNo results for input(s): BNP, PROBNP in the last 168 hours.   DDimer No results for input(s): DDIMER in the last 168 hours.   Radiology    No results found.  Cardiac Studies   Cath   There is mild left ventricular systolic dysfunction.  The left ventricular  ejection fraction is 45-50% by visual estimate.  LV end diastolic pressure is normal.  There is no aortic valve stenosis.  Prox LAD lesion, 100 %stenosed. Chronic total occlusion.  A STENT SYNERGY DES 2.5X38 drug eluting stent was successfully placed.  Post intervention, there is a 0% residual stenosis.   Continue dual antiplatelet therapy for at least a year. Continue aggressive secondary prevention. Patient was loaded with Brilinta in the Cath Lab.  There is mild the vessel.  Wall Motion              Left Heart   Left Ventricle The left ventricle is mildly dilated. There is mild left ventricular systolic dysfunction. LV end diastolic pressure is normal. The left ventricular ejection fraction is 45-50% by visual estimate. There are LV function abnormalities due to segmental dysfunction.    Aortic Valve There is no aortic valve stenosis.    Coronary Diagrams   Diagnostic Diagram       Post-Intervention Diagram         Patient Profile     61 y.o. male with [place], CAD post LAD DES with EF 45%  Assessment & Plan    [place]/CAD  - post LAD prox DES  - DAPT one year  - statin, asa, bb,  ace-I  - cardiac rehab  Mildly reduced EF 45%  - add lisinopril 5mg  PO QD  - continue bb  - may improve post stent  Essential HTN  - Bb new  - Lisinopril new  - Stopped amlodipine (was recently started)  - monitor  - mildly elevated this AM prior to meds  OK for DC home TOC follow up in 1-2 weeks.   Signed, Candee Furbish, MD  08/21/2016, 7:50 AM

## 2016-08-21 NOTE — Telephone Encounter (Signed)
New message    Patient calling the office for samples of medication:   1.  What medication and dosage are you requesting samples for? Brilinta 90 mg  2.  Are you currently out of this medication? Pt needs samples per Neoma Laming, Tourist information centre manager at [employer]. She states pt copay is 300 dollars.

## 2016-08-21 NOTE — Telephone Encounter (Signed)
Spoke with Neoma Laming and she stated that the patient has a copay card but his deductible is over $300, but with this hospital stay he should reach this. I have made her aware that I would place some samples at the front desk and also that the office closes today at 11:30. She will send patients wife over to pick the samples up.

## 2016-08-26 ENCOUNTER — Telehealth (HOSPITAL_COMMUNITY): Payer: Self-pay

## 2016-08-26 NOTE — Telephone Encounter (Signed)
Patient insurance is active and benefits verified. Patient insurance is [employer] - no co-payment, deductible $5000/$964.85 has been met, out of pocket $5000/$964.85 has been met, no co-insurance, no pre-authorization and no limit on visit. Spoke with Vicente Males @ [employer] #[id number] on 08/26/16.

## 2016-08-31 ENCOUNTER — Telehealth (HOSPITAL_COMMUNITY): Payer: Self-pay

## 2016-08-31 NOTE — Telephone Encounter (Signed)
I called patient to discuss scheduling for cardiac rehab. Patient wanted to know the hours of our classes, due to patient working 8-5. Our hours does not work with his work schedule. Patient wanted to know about [facility] hours, patient given there contact information. Referral closed.

## 2016-09-10 ENCOUNTER — Ambulatory Visit (INDEPENDENT_AMBULATORY_CARE_PROVIDER_SITE_OTHER): Payer: No Typology Code available for payment source | Admitting: Cardiology

## 2016-09-10 ENCOUNTER — Encounter (HOSPITAL_COMMUNITY): Payer: Self-pay | Admitting: Interventional Cardiology

## 2016-09-10 VITALS — BP 124/70 | HR 58 | Ht 68.0 in | Wt 162.0 lb

## 2016-09-10 DIAGNOSIS — I10 Essential (primary) hypertension: Secondary | ICD-10-CM | POA: Diagnosis not present

## 2016-09-10 DIAGNOSIS — E7800 Pure hypercholesterolemia, unspecified: Secondary | ICD-10-CM | POA: Diagnosis not present

## 2016-09-10 DIAGNOSIS — I2510 Atherosclerotic heart disease of native coronary artery without angina pectoris: Secondary | ICD-10-CM

## 2016-09-10 NOTE — Patient Instructions (Signed)
Medication Instructions:   NO CHANGE  Labwork:  Your physician recommends that you return for lab work in: 4 WEEKS= DO NOT EAT PRIOR TO LAB WORK  Follow-Up:  Your physician wants you to follow-up in: Ricky Harrison will receive a reminder letter in the mail two months in advance. If you don't receive a letter, please call our office to schedule the follow-up appointment.   If you need a refill on your cardiac medications before your next appointment, please call your pharmacy.

## 2016-09-10 NOTE — Progress Notes (Signed)
HPI: Follow-up coronary artery disease. Patient seen in July 2018 with symptoms concerning for unstable angina. Cardiac catheterization July 2018 showed ejection fraction 45-50%, occluded LAD and no other obstructive disease noted. Patient had drug-eluting stent to his LAD at that time. Since last seen, patient has an occasional soreness in his chest that last 30 seconds and not related to exertion. Unlike his prior pain preceding his LAD stent. He does not have exertional chest pain or dyspnea. No syncope.  Current Outpatient Prescriptions  Medication Sig Dispense Refill  . aspirin 81 MG chewable tablet Chew 1 tablet (81 mg total) by mouth daily.    Marland Kitchen atorvastatin (LIPITOR) 80 MG tablet Take 1 tablet (80 mg total) by mouth daily at 6 PM. 30 tablet 5  . lisinopril (PRINIVIL,ZESTRIL) 5 MG tablet Take 1 tablet (5 mg total) by mouth daily. 30 tablet 5  . metoprolol tartrate (LOPRESSOR) 25 MG tablet Take 0.5 tablets (12.5 mg total) by mouth 2 (two) times daily. 60 tablet 5  . Multiple Vitamin (MULTIVITAMIN WITH MINERALS) TABS Take 1 tablet by mouth daily. Takes on Mondays.    . nitroGLYCERIN (NITROSTAT) 0.4 MG SL tablet Place 1 tablet (0.4 mg total) under the tongue every 5 (five) minutes as needed for chest pain. 25 tablet 2  . ticagrelor (BRILINTA) 90 MG TABS tablet Take 1 tablet (90 mg total) by mouth 2 (two) times daily. 180 tablet 3   No current facility-administered medications for this visit.      Past Medical History:  Diagnosis Date  . Basal cell carcinoma of face 2006  . History of kidney stones   . Hypertension   . Inguinal hernia, right    Dr. Dalbert Batman; "still flares up sometimes" (08/19/2016)  . Pneumonia ~ 1979   "walking"  . Sleep apnea    "had mask; couldn't tolerate it; I usually use nasal strips now" (08/19/2016)    Past Surgical History:  Procedure Laterality Date  . CORONARY STENT INTERVENTION N/A 08/20/2016   Procedure: Coronary Stent Intervention;  Surgeon:  Jettie Booze, MD;  Location: [facility];  Service: Cardiovascular;  Laterality: N/A;  . CYSTOSCOPY W/ STONE MANIPULATION  X 2  . INGUINAL HERNIA REPAIR  01/01/2012   Procedure: LAPAROSCOPIC INGUINAL HERNIA;  Surgeon: Adin Hector, MD;  Location: [facility];  Service: General;  Laterality: Left;  . INGUINAL HERNIA REPAIR Right 2016   Dr. Dalbert Batman  . INGUINAL HERNIA REPAIR Left   . INSERTION OF MESH  01/01/2012   Procedure: INSERTION OF MESH;  Surgeon: Adin Hector, MD;  Location: [facility];  Service: General;  Laterality: Left;  . LEFT HEART CATH AND CORONARY ANGIOGRAPHY N/A 08/20/2016   Procedure: Left Heart Cath and Coronary Angiography;  Surgeon: Jettie Booze, MD;  Location: [facility];  Service: Cardiovascular;  Laterality: N/A;  . LITHOTRIPSY  2008   5 times total - Dr. Risa Grill  . LITHOTRIPSY  X ~ 5  . MOHS SURGERY  2006   basal cell; face  . NASAL SEPTUM SURGERY  2004   Dr. Wilburn Cornelia    Social History   Social History  . Marital status: Married    Spouse name: N/A  . Number of children: 2  . Years of education: N/A   Occupational History  . Not on file.   Social History Main Topics  . Smoking status: Never Smoker  . Smokeless tobacco: Never Used  . Alcohol use 3.0 oz/week  5 Shots of liquor per week  . Drug use: No  . Sexual activity: Yes   Other Topics Concern  . Not on file   Social History Narrative  . No narrative on file    Family History  Problem Relation Age of Onset  . Cancer Mother        throat  . Heart attack Father 68    ROS: no fevers or chills, productive cough, hemoptysis, dysphasia, odynophagia, melena, hematochezia, dysuria, hematuria, rash, seizure activity, orthopnea, PND, pedal edema, claudication. Remaining systems are negative.  Physical Exam: Well-developed well-nourished in no acute distress.  Skin is warm and dry.  HEENT is normal.  Neck is supple.  Chest is clear to auscultation with normal  expansion.  Cardiovascular exam is regular rate and rhythm.  Abdominal exam nontender or distended. No masses palpated.Right groin with no hematoma and no bruit.  Extremities show no edema. neuro grossly intact  ECG- Sinus rhythm, anterolateral TWI; personally reviewed  A/P  1 Coronary artery disease-status post PCI of LAD. Continue aspirin, brilinta and statin. Refer to cardiac rehabilitation.  2 ischemic cardiomyopathy-mildly reduced LV function. Continue beta blocker and ACE inhibitor.  3 hyperlipidemia-continue statin. In 4 weeks check lipids and liver.  5 hypertension-blood pressure controlled. Continue present medications.   Kirk Ruths, MD

## 2016-09-17 ENCOUNTER — Telehealth (HOSPITAL_COMMUNITY): Payer: Self-pay | Admitting: *Deleted

## 2016-09-17 NOTE — Telephone Encounter (Signed)
Returned call to pt from message left earlier.  Message left for pt to please contact cardiac rehab.  Contact information provided. Cherre Huger, BSN Cardiac and Training and development officer

## 2016-09-24 ENCOUNTER — Telehealth (HOSPITAL_COMMUNITY): Payer: Self-pay

## 2016-09-25 NOTE — Telephone Encounter (Signed)
Cardiac Rehab Medication Review by a Pharmacist  Does the patient  feel that his/her medications are working for him/her?  yes  Has the patient been experiencing any side effects to the medications prescribed?  no  Does the patient measure his/her own blood pressure or blood glucose at home?  yes - goes to [employer] to check (~116/78 mmHg)  Does the patient have any problems obtaining medications due to transportation or finances?   no  Understanding of regimen: good Understanding of indications: good Potential of compliance: good   Pharmacist comments: Mr. Hetz states that he thinks his medications are working well for him, he has had no side effects and is pleased with how well his blood pressure has been under control.   Yulissa Needham L. Kyung Rudd, PharmD, [place] PGY1 Pharmacy Resident Pager: [phone]

## 2016-09-29 ENCOUNTER — Encounter (HOSPITAL_COMMUNITY): Payer: Self-pay

## 2016-09-29 ENCOUNTER — Encounter (HOSPITAL_COMMUNITY)
Admission: RE | Admit: 2016-09-29 | Discharge: 2016-09-29 | Disposition: A | Discharge status: Home / Self Care | Payer: No Typology Code available for payment source | Source: Ambulatory Visit | Attending: Cardiology | Admitting: Cardiology

## 2016-09-29 ENCOUNTER — Telehealth: Payer: Self-pay | Admitting: Physician Assistant

## 2016-09-29 ENCOUNTER — Telehealth (HOSPITAL_COMMUNITY): Payer: Self-pay | Admitting: *Deleted

## 2016-09-29 VITALS — BP 120/60 | HR 64 | Ht 67.5 in | Wt 159.4 lb

## 2016-09-29 DIAGNOSIS — Z79899 Other long term (current) drug therapy: Secondary | ICD-10-CM | POA: Insufficient documentation

## 2016-09-29 DIAGNOSIS — Z7902 Long term (current) use of antithrombotics/antiplatelets: Secondary | ICD-10-CM | POA: Diagnosis not present

## 2016-09-29 DIAGNOSIS — Z87442 Personal history of urinary calculi: Secondary | ICD-10-CM | POA: Insufficient documentation

## 2016-09-29 DIAGNOSIS — Z85828 Personal history of other malignant neoplasm of skin: Secondary | ICD-10-CM | POA: Insufficient documentation

## 2016-09-29 DIAGNOSIS — Z955 Presence of coronary angioplasty implant and graft: Secondary | ICD-10-CM | POA: Diagnosis present

## 2016-09-29 DIAGNOSIS — G4730 Sleep apnea, unspecified: Secondary | ICD-10-CM | POA: Insufficient documentation

## 2016-09-29 DIAGNOSIS — I10 Essential (primary) hypertension: Secondary | ICD-10-CM | POA: Insufficient documentation

## 2016-09-29 DIAGNOSIS — Z7982 Long term (current) use of aspirin: Secondary | ICD-10-CM | POA: Diagnosis not present

## 2016-09-29 NOTE — Progress Notes (Signed)
Cardiac Individual Treatment Plan  Patient Details  Name: Ricky Harrison MRN: [id number] Date of Birth: Jun 20, 1955 Referring Provider:     CARDIAC REHAB PHASE II ORIENTATION from 09/29/2016 in [facility]  Referring Provider  Kirk Ruths, MD.      Initial Encounter Date:    CARDIAC REHAB PHASE II ORIENTATION from 09/29/2016 in [facility]  Date  09/29/16  Referring Provider  Kirk Ruths, MD.      Visit Diagnosis: 08/20/16 DES LAD  Patient's Home Medications on Admission:  Current Outpatient Prescriptions:  .  aspirin 81 MG chewable tablet, Chew 1 tablet (81 mg total) by mouth daily., Disp: , Rfl:  .  atorvastatin (LIPITOR) 80 MG tablet, Take 1 tablet (80 mg total) by mouth daily at 6 PM., Disp: 30 tablet, Rfl: 5 .  lisinopril (PRINIVIL,ZESTRIL) 5 MG tablet, Take 1 tablet (5 mg total) by mouth daily., Disp: 30 tablet, Rfl: 5 .  metoprolol tartrate (LOPRESSOR) 25 MG tablet, Take 0.5 tablets (12.5 mg total) by mouth 2 (two) times daily., Disp: 60 tablet, Rfl: 5 .  Multiple Vitamin (MULTIVITAMIN WITH MINERALS) TABS, Take 1 tablet by mouth daily. Takes on Mondays., Disp: , Rfl:  .  nitroGLYCERIN (NITROSTAT) 0.4 MG SL tablet, Place 1 tablet (0.4 mg total) under the tongue every 5 (five) minutes as needed for chest pain. (Patient not taking: Reported on 09/25/2016), Disp: 25 tablet, Rfl: 2 .  ticagrelor (BRILINTA) 90 MG TABS tablet, Take 1 tablet (90 mg total) by mouth 2 (two) times daily., Disp: 180 tablet, Rfl: 3  Past Medical History: Past Medical History:  Diagnosis Date  . Basal cell carcinoma of face 2006  . History of kidney stones   . Hypertension   . Inguinal hernia, right    Dr. Dalbert Batman; "still flares up sometimes" (08/19/2016)  . Pneumonia ~ 1979   "walking"  . Sleep apnea    "had mask; couldn't tolerate it; I usually use nasal strips now" (08/19/2016)    Tobacco Use: History  Smoking Status  . Never  Smoker  Smokeless Tobacco  . Never Used    Labs: Recent Review Flowsheet Data    Labs for ITP Cardiac and Pulmonary Rehab Latest Ref Rng & Units 02/05/2009 07/15/2016 08/19/2016 08/20/2016   Cholestrol 0 - 200 mg/dL 197 161 - 164   LDLCALC 0 - 99 mg/dL 129(H) - - 85   LDLDIRECT mg/dL - 80.0 - -   HDL >40 mg/dL 40.50 35.10(L) - 35(L)   Trlycerides <150 mg/dL 139.0 285.0(H) - 221(H)   Hemoglobin A1c 4.8 - 5.6 % - - 5.3 -      Capillary Blood Glucose: No results found for: GLUCAP   Exercise Target Goals: Date: 09/29/16  Exercise Program Goal: Individual exercise prescription set with THRR, safety & activity barriers. Participant demonstrates ability to understand and report RPE using BORG scale, to self-measure pulse accurately, and to acknowledge the importance of the exercise prescription.  Exercise Prescription Goal: Starting with aerobic activity 30 plus minutes a day, 3 days per week for initial exercise prescription. Provide home exercise prescription and guidelines that participant acknowledges understanding prior to discharge.  Activity Barriers & Risk Stratification:     Activity Barriers & Cardiac Risk Stratification - 09/29/16 0840      Activity Barriers & Cardiac Risk Stratification   Activity Barriers None   Cardiac Risk Stratification High      6 Minute Walk:     6  Minute Walk    Row Name 09/29/16 1340         6 Minute Walk   Phase Initial     Distance 1933 feet     Walk Time 6 minutes     # of Rest Breaks 0     MPH 3.66     METS 4.77     RPE 11     VO2 Peak 16.7     Symptoms No     Resting HR 64 bpm     Resting BP 120/60     Max Ex. HR 82 bpm     Max Ex. BP 158/82     2 Minute Post BP 114/70        Oxygen Initial Assessment:   Oxygen Re-Evaluation:   Oxygen Discharge (Final Oxygen Re-Evaluation):   Initial Exercise Prescription:     Initial Exercise Prescription - 09/29/16 1300      Date of Initial Exercise RX and Referring  Provider   Date 09/29/16   Referring Provider Kirk Ruths, MD.     Treadmill   MPH 2.8   Grade 1   Minutes 10   METs 3.53     Bike   Level 1.4   Minutes 10   METs 4.65     NuStep   Level 4   SPM 90   Minutes 10   METs 3     Prescription Details   Frequency (times per week) 3   Duration Progress to 30 minutes of continuous aerobic without signs/symptoms of physical distress     Intensity   THRR 40-80% of Max Heartrate 64-129   Ratings of Perceived Exertion 11-13   Perceived Dyspnea 0-4     Progression   Progression Continue to progress workloads to maintain intensity without signs/symptoms of physical distress.     Resistance Training   Training Prescription Yes   Weight 4lbs   Reps 10-15      Perform Capillary Blood Glucose checks as needed.  Exercise Prescription Changes:   Exercise Comments:   Exercise Goals and Review:     Exercise Goals    Row Name 09/29/16 1341             Exercise Goals   Increase Physical Activity Yes       Intervention Provide advice, education, support and counseling about physical activity/exercise needs.;Develop an individualized exercise prescription for aerobic and resistive training based on initial evaluation findings, risk stratification, comorbidities and participant's personal goals.       Expected Outcomes Achievement of increased cardiorespiratory fitness and enhanced flexibility, muscular endurance and strength shown through measurements of functional capacity and personal statement of participant.       Increase Strength and Stamina Yes       Intervention Provide advice, education, support and counseling about physical activity/exercise needs.;Develop an individualized exercise prescription for aerobic and resistive training based on initial evaluation findings, risk stratification, comorbidities and participant's personal goals.       Expected Outcomes Achievement of increased cardiorespiratory fitness and  enhanced flexibility, muscular endurance and strength shown through measurements of functional capacity and personal statement of participant.          Exercise Goals Re-Evaluation :    Discharge Exercise Prescription (Final Exercise Prescription Changes):   Nutrition:  Target Goals: Understanding of nutrition guidelines, daily intake of sodium 1500mg , cholesterol 200mg , calories 30% from fat and 7% or less from saturated fats, daily to have 5 or more servings of  fruits and vegetables.  Biometrics:     Pre Biometrics - 09/29/16 1026      Pre Biometrics   Height 5' 7.5" (1.715 m)   Weight 159 lb 6.3 oz (72.3 kg)   Waist Circumference 34.5 inches   Hip Circumference 37.25 inches   Waist to Hip Ratio 0.93 %   BMI (Calculated) 24.58   Triceps Skinfold 14 mm   % Body Fat 23.3 %   Grip Strength 43 kg   Flexibility 8 in   Single Leg Stand 30 seconds       Nutrition Therapy Plan and Nutrition Goals:   Nutrition Discharge: Nutrition Scores:   Nutrition Goals Re-Evaluation:   Nutrition Goals Re-Evaluation:   Nutrition Goals Discharge (Final Nutrition Goals Re-Evaluation):   Psychosocial: Target Goals: Acknowledge presence or absence of significant depression and/or stress, maximize coping skills, provide positive support system. Participant is able to verbalize types and ability to use techniques and skills needed for reducing stress and depression.  Initial Review & Psychosocial Screening:     Initial Psych Review & Screening - 09/29/16 1016      Initial Review   Current issues with None Identified     Family Dynamics   Good Support System? Yes  wife, son, daughter    Comments upon brief assessment, no psychosocial needs identified, no interventions necessary      Barriers   Psychosocial barriers to participate in program There are no identifiable barriers or psychosocial needs.     Screening Interventions   Interventions Encouraged to exercise       Quality of Life Scores:     Quality of Life - 09/29/16 1017      Quality of Life Scores   Health/Function Pre 25 %   Socioeconomic Pre 27 %   Psych/Spiritual Pre 26.4 %   Family Pre 28.8 %   GLOBAL Pre 26.3 %      PHQ-9: Recent Review Flowsheet Data    Depression screen [facility] 2/9 06/08/2016   Decreased Interest 0   Down, Depressed, Hopeless 0   PHQ - 2 Score 0   Altered sleeping 0   Tired, decreased energy 0   Change in appetite 0   Feeling bad or failure about yourself  0   Trouble concentrating 0   Moving slowly or fidgety/restless 0   Suicidal thoughts 0   PHQ-9 Score 0     Interpretation of Total Score  Total Score Depression Severity:  1-4 = Minimal depression, 5-9 = Mild depression, 10-14 = Moderate depression, 15-19 = Moderately severe depression, 20-27 = Severe depression   Psychosocial Evaluation and Intervention:   Psychosocial Re-Evaluation:   Psychosocial Discharge (Final Psychosocial Re-Evaluation):   Vocational Rehabilitation: Provide vocational rehab assistance to qualifying candidates.   Vocational Rehab Evaluation & Intervention:     Vocational Rehab - 09/29/16 1016      Initial Vocational Rehab Evaluation & Intervention   Assessment shows need for Vocational Rehabilitation No      Education: Education Goals: Education classes will be provided on a weekly basis, covering required topics. Participant will state understanding/return demonstration of topics presented.  Learning Barriers/Preferences:     Learning Barriers/Preferences - 09/29/16 0848      Learning Barriers/Preferences   Learning Barriers None   Learning Preferences Audio;Verbal Instruction      Education Topics: Count Your Pulse:  -Group instruction provided by verbal instruction, demonstration, patient participation and written materials to support subject.  Instructors address importance of being able  to find your pulse and how to count your pulse when at home  without a heart monitor.  Patients get hands on experience counting their pulse with staff help and individually.   Heart Attack, Angina, and Risk Factor Modification:  -Group instruction provided by verbal instruction, video, and written materials to support subject.  Instructors address signs and symptoms of angina and heart attacks.    Also discuss risk factors for heart disease and how to make changes to improve heart health risk factors.   Functional Fitness:  -Group instruction provided by verbal instruction, demonstration, patient participation, and written materials to support subject.  Instructors address safety measures for doing things around the house.  Discuss how to get up and down off the floor, how to pick things up properly, how to safely get out of a chair without assistance, and balance training.   Meditation and Mindfulness:  -Group instruction provided by verbal instruction, patient participation, and written materials to support subject.  Instructor addresses importance of mindfulness and meditation practice to help reduce stress and improve awareness.  Instructor also leads participants through a meditation exercise.    Stretching for Flexibility and Mobility:  -Group instruction provided by verbal instruction, patient participation, and written materials to support subject.  Instructors lead participants through series of stretches that are designed to increase flexibility thus improving mobility.  These stretches are additional exercise for major muscle groups that are typically performed during regular warm up and cool down.   Hands Only CPR:  -Group verbal, video, and participation provides a basic overview of AHA guidelines for community CPR. Role-play of emergencies allow participants the opportunity to practice calling for help and chest compression technique with discussion of AED use.   Hypertension: -Group verbal and written instruction that provides a basic  overview of hypertension including the most recent diagnostic guidelines, risk factor reduction with self-care instructions and medication management.    Nutrition I class: Heart Healthy Eating:  -Group instruction provided by PowerPoint slides, verbal discussion, and written materials to support subject matter. The instructor gives an explanation and review of the Therapeutic Lifestyle Changes diet recommendations, which includes a discussion on lipid goals, dietary fat, sodium, fiber, plant stanol/sterol esters, sugar, and the components of a well-balanced, healthy diet.   Nutrition II class: Lifestyle Skills:  -Group instruction provided by PowerPoint slides, verbal discussion, and written materials to support subject matter. The instructor gives an explanation and review of label reading, grocery shopping for heart health, heart healthy recipe modifications, and ways to make healthier choices when eating out.   Diabetes Question & Answer:  -Group instruction provided by PowerPoint slides, verbal discussion, and written materials to support subject matter. The instructor gives an explanation and review of diabetes co-morbidities, pre- and post-prandial blood glucose goals, pre-exercise blood glucose goals, signs, symptoms, and treatment of hypoglycemia and hyperglycemia, and foot care basics.   Diabetes Blitz:  -Group instruction provided by PowerPoint slides, verbal discussion, and written materials to support subject matter. The instructor gives an explanation and review of the physiology behind type 1 and type 2 diabetes, diabetes medications and rational behind using different medications, pre- and post-prandial blood glucose recommendations and Hemoglobin A1c goals, diabetes diet, and exercise including blood glucose guidelines for exercising safely.    Portion Distortion:  -Group instruction provided by PowerPoint slides, verbal discussion, written materials, and food models to support  subject matter. The instructor gives an explanation of serving size versus portion size, changes in portions sizes over  the last 20 years, and what consists of a serving from each food group.   Stress Management:  -Group instruction provided by verbal instruction, video, and written materials to support subject matter.  Instructors review role of stress in heart disease and how to cope with stress positively.     Exercising on Your Own:  -Group instruction provided by verbal instruction, power point, and written materials to support subject.  Instructors discuss benefits of exercise, components of exercise, frequency and intensity of exercise, and end points for exercise.  Also discuss use of nitroglycerin and activating EMS.  Review options of places to exercise outside of rehab.  Review guidelines for sex with heart disease.   Cardiac Drugs I:  -Group instruction provided by verbal instruction and written materials to support subject.  Instructor reviews cardiac drug classes: antiplatelets, anticoagulants, beta blockers, and statins.  Instructor discusses reasons, side effects, and lifestyle considerations for each drug class.   Cardiac Drugs II:  -Group instruction provided by verbal instruction and written materials to support subject.  Instructor reviews cardiac drug classes: angiotensin converting enzyme inhibitors (ACE-I), angiotensin II receptor blockers (ARBs), nitrates, and calcium channel blockers.  Instructor discusses reasons, side effects, and lifestyle considerations for each drug class.   Anatomy and Physiology of the Circulatory System:  Group verbal and written instruction and models provide basic cardiac anatomy and physiology, with the coronary electrical and arterial systems. Review of: AMI, Angina, Valve disease, Heart Failure, Peripheral Artery Disease, Cardiac Arrhythmia, Pacemakers, and the ICD.   Other Education:  -Group or individual verbal, written, or video  instructions that support the educational goals of the cardiac rehab program.   Knowledge Questionnaire Score:     Knowledge Questionnaire Score - 09/29/16 1015      Knowledge Questionnaire Score   Pre Score 20/24      Core Components/Risk Factors/Patient Goals at Admission:     Personal Goals and Risk Factors at Admission - 09/29/16 1332      Core Components/Risk Factors/Patient Goals on Admission   Hypertension Yes   Intervention Provide education on lifestyle modifcations including regular physical activity/exercise, weight management, moderate sodium restriction and increased consumption of fresh fruit, vegetables, and low fat dairy, alcohol moderation, and smoking cessation.;Monitor prescription use compliance.   Expected Outcomes Short Term: Continued assessment and intervention until BP is < 140/67mm HG in hypertensive participants. < 130/21mm HG in hypertensive participants with diabetes, heart failure or chronic kidney disease.;Long Term: Maintenance of blood pressure at goal levels.   Lipids Yes   Intervention Provide education and support for participant on nutrition & aerobic/resistive exercise along with prescribed medications to achieve LDL 70mg , HDL >40mg .   Expected Outcomes Short Term: Participant states understanding of desired cholesterol values and is compliant with medications prescribed. Participant is following exercise prescription and nutrition guidelines.;Long Term: Cholesterol controlled with medications as prescribed, with individualized exercise RX and with personalized nutrition plan. Value goals: LDL < 70mg , HDL > 40 mg.   Personal Goal Other Yes   Personal Goal Patient would like to complete 6 weeks of cardiac rehab and then return to exercise at local gym.   Intervention Patient will participate in cardiac rehab and be provided with an individual home exercise plan that he can continue independently once he completes the program.   Expected Outcomes  Patient will return to exercise at his local gym upon completion of cardiac rehab program.      Core Components/Risk Factors/Patient Goals Review:    Core Components/Risk  Factors/Patient Goals at Discharge (Final Review):    ITP Comments:     ITP Comments    Row Name 09/29/16 0739           ITP Comments Medical Director- Dr. Fransico Him, MD.          Comments:  Patient attended orientation from 0730 to 0930 to review rules and guidelines for program. Completed 6 minute walk test, Intitial ITP, and exercise prescription.  VSS. Telemetry SR with T wave inversion, sl ST elevation in V2.  This has been reviewed by Eugenia Mcalpine PA and Dr. Stanford Breed. See previous progress note. Pt was  asymptomatic with no complaints of cp or sob. Brief Psychosocial Assessment reveals no barriers to to participating in cardiac rehab.  Pt denies any stress, anxiety or depression.  Pt is looking forward to exercising in the cardiac rehab program at 6:45 am. Maurice Small RN, BSN Cardiac and Pulmonary Rehab Nurse Navigator

## 2016-09-29 NOTE — Progress Notes (Signed)
Pt reported having sharp twinges in his chest area that lasted less than 30 second.  Pt averages this about "2" a day.  This is different than the pain pt had on admission to the hospital and subsequent stent placement. This also differs from the soreness pt reported to Dr. Stanford Breed during his follow up. In basket message sent to Dr. Stanford Breed on 8/20. Pt in today for orientation.  Pt placed on the monitor.  Monitor showed deep TW inversion. Bp 120/60. o2 sat 98 HR 58.  Comparison completed with most recent 12 lead ekg which was completed in the office in follow up from his DES to LAD in July. Due to new T wave inversion. Paged for stat 12 lead ekg.  12 lead ekg completed. Showed TW inversion with sl ST elevation.  Called cardmaster, Wannetta Sender.  Advised to call Danya Dunn Pa.  Paged Eugenia Mcalpine, received immediate response.  Relayed this morning events to Danya. Danya consulted with Dr. Stanford Breed. Received response back from [place]. No further intervention is needed at this time.  Danya reports pt has stable Twave inversion based upon review of all his 12 lead ekg. Reiterated that  Pt does not need to be transferred to the ER or troponin drawn.  Pt to be advised that if he should have any symptoms like what brought him to the hospital he should immediately be in contact with the office.  Advised pt of the plan.  Pt verbalized understanding and is in agreement of the plan. Cherre Huger, BSN Cardiac and Training and development officer

## 2016-09-29 NOTE — Telephone Encounter (Signed)
-----   Message from Lelon Perla, MD sent at 09/29/2016  8:00 AM EDT ----- Regarding: RE: ? Cardiac pain Pt has had marked TWI on prior ECGs Kirk Ruths  ----- Message ----- From: Rowe Pavy, RN Sent: 09/29/2016   7:54 AM To: Lelon Perla, MD Subject: RE: ? Cardiac pain                             Dr. Stanford Breed, Pt in this morning.  Pt has new Twave inversion. Pt did not have T wave inversion on the office EKG.  We will get a 12 lead ekg. Pt is asymptomatic with no complaints.   ----- Message ----- From: Lelon Perla, MD Sent: 09/29/2016   7:37 AM To: Rowe Pavy, RN Subject: RE: ? Cardiac pain                             Does not sound cardiac Kirk Ruths  ----- Message ----- From: Rowe Pavy, RN Sent: 09/28/2016   4:55 PM To: Lelon Perla, MD Subject: ? Cardiac pain                                 Dr. Stanford Breed,  The above patient scheduled for orientation s/p DES to LAD.  On his medical interview pt reports having stabbing twinges that last less than 30 seconds and non exertional.  Pt reports that this differs from the pain that brought him to the hospital.  This "pain" also differs from the pain he reported to you on his follow up.   Any further intervention needed?   Thanks for your input Maurice Small RN, BSN Cardiac and Pulmonary Rehab Nurse Navigator

## 2016-09-29 NOTE — Telephone Encounter (Signed)
Cardiac rehab called to notify us that patient arrived for orientation today. He was noting that over the last few weeks he has had twinges of chest discomfort lasting <30 secs at a time about 2x a day, without associated SOB, nausea, diaphoresis. This is completely unlike prior angina. ECG shows biphasic ST-T changes in V2-V3 and deep TWI V4-V6. Reviewed with Dr. Stanford Breed who feels ECG is unchanged from prior going back to earlier in July. He feels patient is OK to proceed with exercise since sx are atypical. VSS with BP 120/60, Pulse 58, pulse ox 98%. D/w Carlette @ [facility] who will make sure patient is aware to call if he develops any recurrent anginal sx. She verbalized understanding and gratitude. Dayna Dunn PA-C

## 2016-10-05 ENCOUNTER — Encounter (HOSPITAL_COMMUNITY)
Admission: RE | Admit: 2016-10-05 | Discharge: 2016-10-05 | Disposition: A | Discharge status: Home / Self Care | Payer: No Typology Code available for payment source | Source: Ambulatory Visit | Attending: Cardiology | Admitting: Cardiology

## 2016-10-05 ENCOUNTER — Encounter (HOSPITAL_COMMUNITY): Payer: No Typology Code available for payment source

## 2016-10-05 ENCOUNTER — Encounter (HOSPITAL_COMMUNITY): Payer: Self-pay

## 2016-10-05 ENCOUNTER — Telehealth (HOSPITAL_COMMUNITY): Payer: Self-pay | Admitting: Cardiac Rehabilitation

## 2016-10-05 DIAGNOSIS — Z955 Presence of coronary angioplasty implant and graft: Secondary | ICD-10-CM

## 2016-10-05 NOTE — Progress Notes (Signed)
Daily Session Note  Patient Details  Name: Ricky Harrison MRN: [id number] Date of Birth: 01/06/1956 Referring Provider:     [place] from 09/29/2016 in [place]  Referring Provider  Kirk Ruths, MD.      Encounter Date: 10/05/2016  Check In:     Session Check In - 10/05/16 0715      Check-In   Location [facility]   Staff Present Su Hilt, MS, ACSM RCEP, Exercise Physiologist;Olinty Foresthill, MS, ACSM CEP, Exercise Physiologist;Pollyanna Levay, RN, Marga Melnick, RN, BSN   Supervising physician immediately available to respond to emergencies [facility] immediately available   Physician(s) Dr. Clementeen Graham   Medication changes reported     No   Fall or balance concerns reported    No   Tobacco Cessation No Change   Warm-up and Cool-down Performed as group-led instruction   VAD Patient? No     Pain Assessment   Currently in Pain? No/denies   Multiple Pain Sites No      Capillary Blood Glucose: No results found for this or any previous visit (from the past 24 hour(s)).    History  Smoking Status  . Never Smoker  Smokeless Tobacco  . Never Used    Goals Met:  Exercise tolerated well  Goals Unmet:  Hypertensive with exercise while exceeding prescribed workload.  Pt advised to resume prescribed workload with BP improvement.  Otherwise VSS.   Comments: Pt started cardiac rehab today.  Pt tolerated light exercise without difficulty.  telemetry-sinus rhytm, asymptomatic.  Medication list reconciled. Pt denies barriers to medicaiton compliance.  PSYCHOSOCIAL ASSESSMENT:  PHQ-0. Pt exhibits positive coping skills, hopeful outlook with supportive family. No psychosocial needs identified at this time, no psychosocial interventions necessary.    Pt enjoys going to gym and yardwork. 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 Pt did not lift pavers but help prepare  site for them. Pt inquires about his current weight lifting restrictions. Message sent to Dr. Stanford Breed for recommendation.  Pt goals for cardiac rehab are to be able to return to gym for exercise.  Pt oriented to exercise equipment and routine.    Understanding verbalized.   Dr. Fransico Him is Medical Director for Cardiac Rehab at [facility].

## 2016-10-05 NOTE — Telephone Encounter (Signed)
-----   Message from Lelon Perla, MD sent at 10/05/2016 12:41 PM EDT ----- Regarding: RE: cardiac rehab  No restriction ----- Message ----- From: Lowell Guitar, RN Sent: 10/05/2016  10:32 AM To: Lelon Perla, MD Subject: cardiac rehab                                  Dear Dr. Stanford Breed,  Pt started cardiac rehab today.  Good first day!  Peak BP: 178/80 on bicycle exceeding his prescribed workload.  Otherwise VS stable.  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  What is recommended weight lifting restriction for him?  Pt s/p 08/21/2016 DES LAD (100% CTO LAD) EF-45-50%.  Thank you, Andi Hence, RN, BSN Cardiac Pulmonary Rehab

## 2016-10-07 ENCOUNTER — Encounter (HOSPITAL_COMMUNITY): Payer: No Typology Code available for payment source

## 2016-10-07 ENCOUNTER — Encounter (HOSPITAL_COMMUNITY)
Admission: RE | Admit: 2016-10-07 | Discharge: 2016-10-07 | Disposition: A | Discharge status: Home / Self Care | Payer: No Typology Code available for payment source | Source: Ambulatory Visit | Attending: Cardiology | Admitting: Cardiology

## 2016-10-07 DIAGNOSIS — Z955 Presence of coronary angioplasty implant and graft: Secondary | ICD-10-CM | POA: Diagnosis not present

## 2016-10-09 ENCOUNTER — Encounter (HOSPITAL_COMMUNITY): Payer: No Typology Code available for payment source

## 2016-10-09 ENCOUNTER — Encounter (HOSPITAL_COMMUNITY)
Admission: RE | Admit: 2016-10-09 | Discharge: 2016-10-09 | Disposition: A | Discharge status: Home / Self Care | Payer: No Typology Code available for payment source | Source: Ambulatory Visit | Attending: Cardiology | Admitting: Cardiology

## 2016-10-09 DIAGNOSIS — Z955 Presence of coronary angioplasty implant and graft: Secondary | ICD-10-CM | POA: Diagnosis not present

## 2016-10-14 ENCOUNTER — Encounter (HOSPITAL_COMMUNITY): Payer: No Typology Code available for payment source

## 2016-10-14 ENCOUNTER — Encounter (HOSPITAL_COMMUNITY)
Admission: RE | Admit: 2016-10-14 | Discharge: 2016-10-14 | Disposition: A | Discharge status: Home / Self Care | Payer: No Typology Code available for payment source | Source: Ambulatory Visit | Attending: Cardiology | Admitting: Cardiology

## 2016-10-14 DIAGNOSIS — Z87442 Personal history of urinary calculi: Secondary | ICD-10-CM | POA: Insufficient documentation

## 2016-10-14 DIAGNOSIS — Z7982 Long term (current) use of aspirin: Secondary | ICD-10-CM | POA: Insufficient documentation

## 2016-10-14 DIAGNOSIS — Z85828 Personal history of other malignant neoplasm of skin: Secondary | ICD-10-CM | POA: Insufficient documentation

## 2016-10-14 DIAGNOSIS — Z955 Presence of coronary angioplasty implant and graft: Secondary | ICD-10-CM | POA: Insufficient documentation

## 2016-10-14 DIAGNOSIS — Z79899 Other long term (current) drug therapy: Secondary | ICD-10-CM | POA: Insufficient documentation

## 2016-10-14 DIAGNOSIS — I10 Essential (primary) hypertension: Secondary | ICD-10-CM | POA: Insufficient documentation

## 2016-10-14 DIAGNOSIS — G4730 Sleep apnea, unspecified: Secondary | ICD-10-CM | POA: Insufficient documentation

## 2016-10-14 DIAGNOSIS — Z7902 Long term (current) use of antithrombotics/antiplatelets: Secondary | ICD-10-CM | POA: Insufficient documentation

## 2016-10-14 NOTE — Progress Notes (Signed)
Reviewed home exercise guidelines with patient including endpoints, temperature precautions, target heart rate and rate of perceived exertion. Pt is walking 20-25 minutes daily as his mode of home exercise. Pt voices understanding of instructions given. Ricky Passer, MS, ACSM CEP

## 2016-10-16 ENCOUNTER — Encounter (HOSPITAL_COMMUNITY): Payer: No Typology Code available for payment source

## 2016-10-16 ENCOUNTER — Encounter (HOSPITAL_COMMUNITY)
Admission: RE | Admit: 2016-10-16 | Discharge: 2016-10-16 | Disposition: A | Discharge status: Home / Self Care | Payer: No Typology Code available for payment source | Source: Ambulatory Visit | Attending: Cardiology | Admitting: Cardiology

## 2016-10-16 DIAGNOSIS — Z955 Presence of coronary angioplasty implant and graft: Secondary | ICD-10-CM

## 2016-10-19 ENCOUNTER — Encounter (HOSPITAL_COMMUNITY): Payer: No Typology Code available for payment source

## 2016-10-19 ENCOUNTER — Encounter (HOSPITAL_COMMUNITY)
Admission: RE | Admit: 2016-10-19 | Discharge: 2016-10-19 | Disposition: A | Discharge status: Home / Self Care | Payer: No Typology Code available for payment source | Source: Ambulatory Visit | Attending: Cardiology | Admitting: Cardiology

## 2016-10-19 DIAGNOSIS — Z955 Presence of coronary angioplasty implant and graft: Secondary | ICD-10-CM

## 2016-10-20 LAB — HEPATIC FUNCTION PANEL
ALBUMIN: 4.5 g/dL (ref 3.5–5.5)
ALK PHOS: 85 IU/L (ref 39–117)
ALT: 41 IU/L (ref 0–44)
AST: 24 IU/L (ref 0–40)
BILIRUBIN TOTAL: 0.7 mg/dL (ref 0.0–1.2)
Bilirubin, Direct: 0.19 mg/dL (ref 0.00–0.40)
TOTAL PROTEIN: 6.6 g/dL (ref 6.0–8.5)

## 2016-10-20 LAB — LIPID PANEL
CHOLESTEROL TOTAL: 100 mg/dL (ref 100–199)
Chol/HDL Ratio: 3.1 ratio (ref 0.0–5.0)
HDL: 32 mg/dL — ABNORMAL LOW (ref >39)
LDL Calculated: 13 mg/dL (ref 0–99)
Triglycerides: 276 mg/dL — ABNORMAL HIGH (ref 0–149)
VLDL Cholesterol Cal: 55 mg/dL — ABNORMAL HIGH (ref 5–40)

## 2016-10-21 ENCOUNTER — Encounter (HOSPITAL_COMMUNITY): Payer: No Typology Code available for payment source

## 2016-10-21 ENCOUNTER — Encounter (HOSPITAL_COMMUNITY)
Admission: RE | Admit: 2016-10-21 | Discharge: 2016-10-21 | Disposition: A | Discharge status: Home / Self Care | Payer: No Typology Code available for payment source | Source: Ambulatory Visit | Attending: Cardiology | Admitting: Cardiology

## 2016-10-21 ENCOUNTER — Encounter: Payer: Self-pay | Admitting: *Deleted

## 2016-10-21 DIAGNOSIS — Z955 Presence of coronary angioplasty implant and graft: Secondary | ICD-10-CM

## 2016-10-21 NOTE — Progress Notes (Signed)
Cardiac Individual Treatment Plan  Patient Details  Name: Ricky Harrison MRN: [id number] Date of Birth: 28-Jun-1955 Referring Provider:     CARDIAC REHAB PHASE II ORIENTATION from 09/29/2016 in [place]  Referring Provider  Kirk Ruths, MD.      Initial Encounter Date:    CARDIAC REHAB PHASE II ORIENTATION from 09/29/2016 in [place]  Date  09/29/16  Referring Provider  Kirk Ruths, MD.      Visit Diagnosis: 08/20/16 DES LAD  Patient's Home Medications on Admission:  Current Outpatient Prescriptions:  .  aspirin 81 MG chewable tablet, Chew 1 tablet (81 mg total) by mouth daily., Disp: , Rfl:  .  atorvastatin (LIPITOR) 80 MG tablet, Take 1 tablet (80 mg total) by mouth daily at 6 PM., Disp: 30 tablet, Rfl: 5 .  lisinopril (PRINIVIL,ZESTRIL) 5 MG tablet, Take 1 tablet (5 mg total) by mouth daily., Disp: 30 tablet, Rfl: 5 .  metoprolol tartrate (LOPRESSOR) 25 MG tablet, Take 0.5 tablets (12.5 mg total) by mouth 2 (two) times daily., Disp: 60 tablet, Rfl: 5 .  Multiple Vitamin (MULTIVITAMIN WITH MINERALS) TABS, Take 1 tablet by mouth daily. Takes on Mondays., Disp: , Rfl:  .  nitroGLYCERIN (NITROSTAT) 0.4 MG SL tablet, Place 1 tablet (0.4 mg total) under the tongue every 5 (five) minutes as needed for chest pain., Disp: 25 tablet, Rfl: 2 .  ticagrelor (BRILINTA) 90 MG TABS tablet, Take 1 tablet (90 mg total) by mouth 2 (two) times daily., Disp: 180 tablet, Rfl: 3  Past Medical History: Past Medical History:  Diagnosis Date  . Basal cell carcinoma of face 2006  . History of kidney stones   . Hypertension   . Inguinal hernia, right    Dr. Dalbert Batman; "still flares up sometimes" (08/19/2016)  . Pneumonia ~ 1979   "walking"  . Sleep apnea    "had mask; couldn't tolerate it; I usually use nasal strips now" (08/19/2016)    Tobacco Use: History  Smoking Status  . Never Smoker  Smokeless Tobacco  . Never Used     Labs: Recent Review Flowsheet Data    Labs for ITP Cardiac and Pulmonary Rehab Latest Ref Rng & Units 02/05/2009 07/15/2016 08/19/2016 08/20/2016 10/20/2016   Cholestrol 100 - 199 mg/dL 197 161 - 164 100   LDLCALC 0 - 99 mg/dL 129(H) - - 85 13   LDLDIRECT mg/dL - 80.0 - - -   HDL >39 mg/dL 40.50 35.10(L) - 35(L) 32(L)   Trlycerides 0 - 149 mg/dL 139.0 285.0(H) - 221(H) 276(H)   Hemoglobin A1c 4.8 - 5.6 % - - 5.3 - -      Capillary Blood Glucose: No results found for: GLUCAP   Exercise Target Goals:    Exercise Program Goal: Individual exercise prescription set with THRR, safety & activity barriers. Participant demonstrates ability to understand and report RPE using BORG scale, to self-measure pulse accurately, and to acknowledge the importance of the exercise prescription.  Exercise Prescription Goal: Starting with aerobic activity 30 plus minutes a day, 3 days per week for initial exercise prescription. Provide home exercise prescription and guidelines that participant acknowledges understanding prior to discharge.  Activity Barriers & Risk Stratification:     Activity Barriers & Cardiac Risk Stratification - 09/29/16 0840      Activity Barriers & Cardiac Risk Stratification   Activity Barriers None   Cardiac Risk Stratification High      6 Minute Walk:  6 Minute Walk    Row Name 09/29/16 1340         6 Minute Walk   Phase Initial     Distance 1933 feet     Walk Time 6 minutes     # of Rest Breaks 0     MPH 3.66     METS 4.77     RPE 11     VO2 Peak 16.7     Symptoms No     Resting HR 64 bpm     Resting BP 120/60     Max Ex. HR 82 bpm     Max Ex. BP 158/82     2 Minute Post BP 114/70        Oxygen Initial Assessment:   Oxygen Re-Evaluation:   Oxygen Discharge (Final Oxygen Re-Evaluation):   Initial Exercise Prescription:     Initial Exercise Prescription - 09/29/16 1300      Date of Initial Exercise RX and Referring Provider   Date  09/29/16   Referring Provider Kirk Ruths, MD.     Treadmill   MPH 2.8   Grade 1   Minutes 10   METs 3.53     Bike   Level 1.4   Minutes 10   METs 4.65     NuStep   Level 4   SPM 90   Minutes 10   METs 3     Prescription Details   Frequency (times per week) 3   Duration Progress to 30 minutes of continuous aerobic without signs/symptoms of physical distress     Intensity   THRR 40-80% of Max Heartrate 64-129   Ratings of Perceived Exertion 11-13   Perceived Dyspnea 0-4     Progression   Progression Continue to progress workloads to maintain intensity without signs/symptoms of physical distress.     Resistance Training   Training Prescription Yes   Weight 4lbs   Reps 10-15      Perform Capillary Blood Glucose checks as needed.  Exercise Prescription Changes:     Exercise Prescription Changes    Row Name 10/14/16 0800             Response to Exercise   Blood Pressure (Admit) 112/66       Blood Pressure (Exercise) 144/70       Blood Pressure (Exit) 132/84       Heart Rate (Admit) 68 bpm       Heart Rate (Exercise) 94 bpm       Heart Rate (Exit) 54 bpm       Rating of Perceived Exertion (Exercise) 14       Symptoms none       Duration Progress to 30 minutes of  aerobic without signs/symptoms of physical distress       Intensity THRR unchanged         Progression   Progression Continue to progress workloads to maintain intensity without signs/symptoms of physical distress.       Average METs 3.5         Resistance Training   Training Prescription Yes       Weight 4lbs       Reps 10-15         Interval Training   Interval Training No         Treadmill   MPH 2.8       Grade 1       Minutes 10       METs  3.53         Bike   Level 1       Minutes 10       METs 3.61         NuStep   Level 5       SPM 90       Minutes 10       METs 3.3         Home Exercise Plan   Plans to continue exercise at Home (comment)       Frequency Add 4  additional days to program exercise sessions.       Initial Home Exercises Provided 10/14/16          Exercise Comments:     Exercise Comments    Row Name 10/14/16 [phone]           Exercise Comments Reviewed home exercise, METs and goals with patient. Pt currently walking 20 -25 minutes daily. Encouraged to increase to 30 minutes and pt is amenable to this.          Exercise Goals and Review:     Exercise Goals    Row Name 09/29/16 1341             Exercise Goals   Increase Physical Activity Yes       Intervention Provide advice, education, support and counseling about physical activity/exercise needs.;Develop an individualized exercise prescription for aerobic and resistive training based on initial evaluation findings, risk stratification, comorbidities and participant's personal goals.       Expected Outcomes Achievement of increased cardiorespiratory fitness and enhanced flexibility, muscular endurance and strength shown through measurements of functional capacity and personal statement of participant.       Increase Strength and Stamina Yes       Intervention Provide advice, education, support and counseling about physical activity/exercise needs.;Develop an individualized exercise prescription for aerobic and resistive training based on initial evaluation findings, risk stratification, comorbidities and participant's personal goals.       Expected Outcomes Achievement of increased cardiorespiratory fitness and enhanced flexibility, muscular endurance and strength shown through measurements of functional capacity and personal statement of participant.          Exercise Goals Re-Evaluation :     Exercise Goals Re-Evaluation    Row Name 10/14/16 0818             Exercise Goal Re-Evaluation   Exercise Goals Review Increase Physical Activity;Increase Strength and Stamina;Able to understand and use rate of perceived exertion (RPE) scale;Knowledge and understanding of  Target Heart Rate Range (THRR);Understanding of Exercise Prescription       Comments Patient is currently walking 20-25 minutes daily, encouraged pt to increase to 30 minutes daily. Reviewed home exercise guidelines including THRR and RPE scale.        Expected Outcomes Patient will continue walking daily, increasing duration to 30 minutes daily.           Discharge Exercise Prescription (Final Exercise Prescription Changes):     Exercise Prescription Changes - 10/14/16 0800      Response to Exercise   Blood Pressure (Admit) 112/66   Blood Pressure (Exercise) 144/70   Blood Pressure (Exit) 132/84   Heart Rate (Admit) 68 bpm   Heart Rate (Exercise) 94 bpm   Heart Rate (Exit) 54 bpm   Rating of Perceived Exertion (Exercise) 14   Symptoms none   Duration Progress to 30 minutes of  aerobic without signs/symptoms of physical distress  Intensity THRR unchanged     Progression   Progression Continue to progress workloads to maintain intensity without signs/symptoms of physical distress.   Average METs 3.5     Resistance Training   Training Prescription Yes   Weight 4lbs   Reps 10-15     Interval Training   Interval Training No     Treadmill   MPH 2.8   Grade 1   Minutes 10   METs 3.53     Bike   Level 1   Minutes 10   METs 3.61     NuStep   Level 5   SPM 90   Minutes 10   METs 3.3     Home Exercise Plan   Plans to continue exercise at Home (comment)   Frequency Add 4 additional days to program exercise sessions.   Initial Home Exercises Provided 10/14/16      Nutrition:  Target Goals: Understanding of nutrition guidelines, daily intake of sodium 1500mg , cholesterol 200mg , calories 30% from fat and 7% or less from saturated fats, daily to have 5 or more servings of fruits and vegetables.  Biometrics:     Pre Biometrics - 09/29/16 1026      Pre Biometrics   Height 5' 7.5" (1.715 m)   Weight 159 lb 6.3 oz (72.3 kg)   Waist Circumference 34.5 inches    Hip Circumference 37.25 inches   Waist to Hip Ratio 0.93 %   BMI (Calculated) 24.58   Triceps Skinfold 14 mm   % Body Fat 23.3 %   Grip Strength 43 kg   Flexibility 8 in   Single Leg Stand 30 seconds       Nutrition Therapy Plan and Nutrition Goals:   Nutrition Discharge: Nutrition Scores:   Nutrition Goals Re-Evaluation:   Nutrition Goals Re-Evaluation:   Nutrition Goals Discharge (Final Nutrition Goals Re-Evaluation):   Psychosocial: Target Goals: Acknowledge presence or absence of significant depression and/or stress, maximize coping skills, provide positive support system. Participant is able to verbalize types and ability to use techniques and skills needed for reducing stress and depression.  Initial Review & Psychosocial Screening:     Initial Psych Review & Screening - 09/29/16 1016      Initial Review   Current issues with None Identified     Family Dynamics   Good Support System? Yes  wife, son, daughter    Comments upon brief assessment, no psychosocial needs identified, no interventions necessary      Barriers   Psychosocial barriers to participate in program There are no identifiable barriers or psychosocial needs.     Screening Interventions   Interventions Encouraged to exercise      Quality of Life Scores:     Quality of Life - 09/29/16 1017      Quality of Life Scores   Health/Function Pre 25 %   Socioeconomic Pre 27 %   Psych/Spiritual Pre 26.4 %   Family Pre 28.8 %   GLOBAL Pre 26.3 %      PHQ-9: Recent Review Flowsheet Data    Depression screen [facility] 2/9 10/05/2016 06/08/2016   Decreased Interest 0 0   Down, Depressed, Hopeless 0 0   PHQ - 2 Score 0 0   Altered sleeping - 0   Tired, decreased energy - 0   Change in appetite - 0   Feeling bad or failure about yourself  - 0   Trouble concentrating - 0   Moving slowly or fidgety/restless - 0  Suicidal thoughts - 0   PHQ-9 Score - 0     Interpretation of Total Score  Total  Score Depression Severity:  1-4 = Minimal depression, 5-9 = Mild depression, 10-14 = Moderate depression, 15-19 = Moderately severe depression, 20-27 = Severe depression   Psychosocial Evaluation and Intervention:     Psychosocial Evaluation - 10/05/16 1040      Psychosocial Evaluation & Interventions   Interventions Encouraged to exercise with the program and follow exercise prescription   Comments no psychosocial needs identified, no interventions necessary    Expected Outcomes pt will exhibit positive outlook with good coping skills.    Continue Psychosocial Services  No Follow up required      Psychosocial Re-Evaluation:     Psychosocial Re-Evaluation    [place]ard Name 10/20/16 1632             Psychosocial Re-Evaluation   Current issues with None Identified       Comments no psychosocial needs identified, no interventions necessary        Expected Outcomes pt will exhibit positive outlook with good coping skills.        Interventions Encouraged to attend Cardiac Rehabilitation for the exercise       Continue Psychosocial Services  No Follow up required          Psychosocial Discharge (Final Psychosocial Re-Evaluation):     Psychosocial Re-Evaluation - 10/20/16 1632      Psychosocial Re-Evaluation   Current issues with None Identified   Comments no psychosocial needs identified, no interventions necessary    Expected Outcomes pt will exhibit positive outlook with good coping skills.    Interventions Encouraged to attend Cardiac Rehabilitation for the exercise   Continue Psychosocial Services  No Follow up required      Vocational Rehabilitation: Provide vocational rehab assistance to qualifying candidates.   Vocational Rehab Evaluation & Intervention:     Vocational Rehab - 09/29/16 1016      Initial Vocational Rehab Evaluation & Intervention   Assessment shows need for Vocational Rehabilitation No      Education: Education Goals: Education classes will be  provided on a weekly basis, covering required topics. Participant will state understanding/return demonstration of topics presented.  Learning Barriers/Preferences:     Learning Barriers/Preferences - 09/29/16 0848      Learning Barriers/Preferences   Learning Barriers None   Learning Preferences Audio;Verbal Instruction      Education Topics: Count Your Pulse:  -Group instruction provided by verbal instruction, demonstration, patient participation and written materials to support subject.  Instructors address importance of being able to find your pulse and how to count your pulse when at home without a heart monitor.  Patients get hands on experience counting their pulse with staff help and individually.   Heart Attack, Angina, and Risk Factor Modification:  -Group instruction provided by verbal instruction, video, and written materials to support subject.  Instructors address signs and symptoms of angina and heart attacks.    Also discuss risk factors for heart disease and how to make changes to improve heart health risk factors.   Functional Fitness:  -Group instruction provided by verbal instruction, demonstration, patient participation, and written materials to support subject.  Instructors address safety measures for doing things around the house.  Discuss how to get up and down off the floor, how to pick things up properly, how to safely get out of a chair without assistance, and balance training.   Meditation and Mindfulness:  -Group  instruction provided by verbal instruction, patient participation, and written materials to support subject.  Instructor addresses importance of mindfulness and meditation practice to help reduce stress and improve awareness.  Instructor also leads participants through a meditation exercise.    Stretching for Flexibility and Mobility:  -Group instruction provided by verbal instruction, patient participation, and written materials to support  subject.  Instructors lead participants through series of stretches that are designed to increase flexibility thus improving mobility.  These stretches are additional exercise for major muscle groups that are typically performed during regular warm up and cool down.   Hands Only CPR:  -Group verbal, video, and participation provides a basic overview of AHA guidelines for community CPR. Role-play of emergencies allow participants the opportunity to practice calling for help and chest compression technique with discussion of AED use.   Hypertension: -Group verbal and written instruction that provides a basic overview of hypertension including the most recent diagnostic guidelines, risk factor reduction with self-care instructions and medication management.    Nutrition I class: Heart Healthy Eating:  -Group instruction provided by PowerPoint slides, verbal discussion, and written materials to support subject matter. The instructor gives an explanation and review of the Therapeutic Lifestyle Changes diet recommendations, which includes a discussion on lipid goals, dietary fat, sodium, fiber, plant stanol/sterol esters, sugar, and the components of a well-balanced, healthy diet.   Nutrition II class: Lifestyle Skills:  -Group instruction provided by PowerPoint slides, verbal discussion, and written materials to support subject matter. The instructor gives an explanation and review of label reading, grocery shopping for heart health, heart healthy recipe modifications, and ways to make healthier choices when eating out.   Diabetes Question & Answer:  -Group instruction provided by PowerPoint slides, verbal discussion, and written materials to support subject matter. The instructor gives an explanation and review of diabetes co-morbidities, pre- and post-prandial blood glucose goals, pre-exercise blood glucose goals, signs, symptoms, and treatment of hypoglycemia and hyperglycemia, and foot care  basics.   Diabetes Blitz:  -Group instruction provided by PowerPoint slides, verbal discussion, and written materials to support subject matter. The instructor gives an explanation and review of the physiology behind type 1 and type 2 diabetes, diabetes medications and rational behind using different medications, pre- and post-prandial blood glucose recommendations and Hemoglobin A1c goals, diabetes diet, and exercise including blood glucose guidelines for exercising safely.    Portion Distortion:  -Group instruction provided by PowerPoint slides, verbal discussion, written materials, and food models to support subject matter. The instructor gives an explanation of serving size versus portion size, changes in portions sizes over the last 20 years, and what consists of a serving from each food group.   Stress Management:  -Group instruction provided by verbal instruction, video, and written materials to support subject matter.  Instructors review role of stress in heart disease and how to cope with stress positively.     Exercising on Your Own:  -Group instruction provided by verbal instruction, power point, and written materials to support subject.  Instructors discuss benefits of exercise, components of exercise, frequency and intensity of exercise, and end points for exercise.  Also discuss use of nitroglycerin and activating EMS.  Review options of places to exercise outside of rehab.  Review guidelines for sex with heart disease.   Cardiac Drugs I:  -Group instruction provided by verbal instruction and written materials to support subject.  Instructor reviews cardiac drug classes: antiplatelets, anticoagulants, beta blockers, and statins.  Instructor discusses reasons, side effects, and  lifestyle considerations for each drug class.   Cardiac Drugs II:  -Group instruction provided by verbal instruction and written materials to support subject.  Instructor reviews cardiac drug classes:  angiotensin converting enzyme inhibitors (ACE-I), angiotensin II receptor blockers (ARBs), nitrates, and calcium channel blockers.  Instructor discusses reasons, side effects, and lifestyle considerations for each drug class.   Anatomy and Physiology of the Circulatory System:  Group verbal and written instruction and models provide basic cardiac anatomy and physiology, with the coronary electrical and arterial systems. Review of: AMI, Angina, Valve disease, Heart Failure, Peripheral Artery Disease, Cardiac Arrhythmia, Pacemakers, and the ICD.   Other Education:  -Group or individual verbal, written, or video instructions that support the educational goals of the cardiac rehab program.   Knowledge Questionnaire Score:     Knowledge Questionnaire Score - 09/29/16 1015      Knowledge Questionnaire Score   Pre Score 20/24      Core Components/Risk Factors/Patient Goals at Admission:     Personal Goals and Risk Factors at Admission - 09/29/16 1332      Core Components/Risk Factors/Patient Goals on Admission   Hypertension Yes   Intervention Provide education on lifestyle modifcations including regular physical activity/exercise, weight management, moderate sodium restriction and increased consumption of fresh fruit, vegetables, and low fat dairy, alcohol moderation, and smoking cessation.;Monitor prescription use compliance.   Expected Outcomes Short Term: Continued assessment and intervention until BP is < 140/59mm HG in hypertensive participants. < 130/30mm HG in hypertensive participants with diabetes, heart failure or chronic kidney disease.;Long Term: Maintenance of blood pressure at goal levels.   Lipids Yes   Intervention Provide education and support for participant on nutrition & aerobic/resistive exercise along with prescribed medications to achieve LDL 70mg , HDL >40mg .   Expected Outcomes Short Term: Participant states understanding of desired cholesterol values and is  compliant with medications prescribed. Participant is following exercise prescription and nutrition guidelines.;Long Term: Cholesterol controlled with medications as prescribed, with individualized exercise RX and with personalized nutrition plan. Value goals: LDL < 70mg , HDL > 40 mg.   Personal Goal Other Yes   Personal Goal Patient would like to complete 6 weeks of cardiac rehab and then return to exercise at local gym.   Intervention Patient will participate in cardiac rehab and be provided with an individual home exercise plan that he can continue independently once he completes the program.   Expected Outcomes Patient will return to exercise at his local gym upon completion of cardiac rehab program.      Core Components/Risk Factors/Patient Goals Review:      Goals and Risk Factor Review    Row Name 10/20/16 1628             Core Components/Risk Factors/Patient Goals Review   Personal Goals Review Hypertension;Lipids;Other       Review pt with mulitiple CAD RF demonstrates eagerness to participate in CR exercise, nutrition and education.         Expected Outcomes pt will participate in CR exercise, nutrition and and lifestyle modification for RF reducation.           Core Components/Risk Factors/Patient Goals at Discharge (Final Review):      Goals and Risk Factor Review - 10/20/16 1628      Core Components/Risk Factors/Patient Goals Review   Personal Goals Review Hypertension;Lipids;Other   Review pt with mulitiple CAD RF demonstrates eagerness to participate in CR exercise, nutrition and education.     Expected Outcomes pt will participate  in CR exercise, nutrition and and lifestyle modification for RF reducation.       ITP Comments:     ITP Comments    Row Name 09/29/16 0739 10/21/16 0801         ITP Comments Medical Director- Dr. Fransico Him, MD. Medical Director- Dr. Fransico Him, MD.         Comments: Pt is making expected progress toward personal goals  after completing 7 sessions. Recommend continued exercise and life style modification education including  stress management and relaxation techniques to decrease cardiac risk profile.

## 2016-10-23 ENCOUNTER — Encounter (HOSPITAL_COMMUNITY): Admission: RE | Admit: 2016-10-23 | Payer: No Typology Code available for payment source | Source: Ambulatory Visit

## 2016-10-23 ENCOUNTER — Encounter (HOSPITAL_COMMUNITY): Payer: No Typology Code available for payment source

## 2016-10-26 ENCOUNTER — Encounter (HOSPITAL_COMMUNITY): Payer: No Typology Code available for payment source

## 2016-10-26 ENCOUNTER — Encounter (HOSPITAL_COMMUNITY)
Admission: RE | Admit: 2016-10-26 | Discharge: 2016-10-26 | Disposition: A | Discharge status: Home / Self Care | Payer: No Typology Code available for payment source | Source: Ambulatory Visit | Attending: Cardiology | Admitting: Cardiology

## 2016-10-26 DIAGNOSIS — Z955 Presence of coronary angioplasty implant and graft: Secondary | ICD-10-CM

## 2016-10-27 ENCOUNTER — Encounter (HOSPITAL_COMMUNITY): Payer: Self-pay | Admitting: Cardiac Rehabilitation

## 2016-10-28 ENCOUNTER — Other Ambulatory Visit: Payer: Self-pay | Admitting: Cardiology

## 2016-10-28 ENCOUNTER — Encounter (HOSPITAL_COMMUNITY): Payer: No Typology Code available for payment source

## 2016-10-28 ENCOUNTER — Encounter (HOSPITAL_COMMUNITY)
Admission: RE | Admit: 2016-10-28 | Discharge: 2016-10-28 | Disposition: A | Discharge status: Home / Self Care | Payer: No Typology Code available for payment source | Source: Ambulatory Visit | Attending: Cardiology | Admitting: Cardiology

## 2016-10-28 DIAGNOSIS — Z955 Presence of coronary angioplasty implant and graft: Secondary | ICD-10-CM

## 2016-10-30 ENCOUNTER — Encounter (HOSPITAL_COMMUNITY): Payer: No Typology Code available for payment source

## 2016-10-30 ENCOUNTER — Encounter (HOSPITAL_COMMUNITY)
Admission: RE | Admit: 2016-10-30 | Discharge: 2016-10-30 | Disposition: A | Discharge status: Home / Self Care | Payer: No Typology Code available for payment source | Source: Ambulatory Visit | Attending: Cardiology | Admitting: Cardiology

## 2016-10-30 DIAGNOSIS — Z955 Presence of coronary angioplasty implant and graft: Secondary | ICD-10-CM

## 2016-11-02 ENCOUNTER — Encounter (HOSPITAL_COMMUNITY)
Admission: RE | Admit: 2016-11-02 | Discharge: 2016-11-02 | Disposition: A | Discharge status: Home / Self Care | Payer: No Typology Code available for payment source | Source: Ambulatory Visit | Attending: Cardiology | Admitting: Cardiology

## 2016-11-02 ENCOUNTER — Encounter (HOSPITAL_COMMUNITY): Payer: No Typology Code available for payment source

## 2016-11-02 DIAGNOSIS — Z955 Presence of coronary angioplasty implant and graft: Secondary | ICD-10-CM

## 2016-11-04 ENCOUNTER — Encounter (HOSPITAL_COMMUNITY): Payer: No Typology Code available for payment source

## 2016-11-04 ENCOUNTER — Encounter (HOSPITAL_COMMUNITY)
Admission: RE | Admit: 2016-11-04 | Discharge: 2016-11-04 | Disposition: A | Discharge status: Home / Self Care | Payer: No Typology Code available for payment source | Source: Ambulatory Visit | Attending: Cardiology | Admitting: Cardiology

## 2016-11-04 VITALS — BP 128/78 | HR 77 | Ht 67.5 in | Wt 160.7 lb

## 2016-11-04 DIAGNOSIS — Z955 Presence of coronary angioplasty implant and graft: Secondary | ICD-10-CM

## 2016-11-06 ENCOUNTER — Encounter (HOSPITAL_COMMUNITY): Payer: No Typology Code available for payment source

## 2016-11-09 ENCOUNTER — Encounter (HOSPITAL_COMMUNITY): Payer: No Typology Code available for payment source

## 2016-11-11 ENCOUNTER — Encounter (HOSPITAL_COMMUNITY): Admission: RE | Admit: 2016-11-11 | Payer: No Typology Code available for payment source | Source: Ambulatory Visit

## 2016-11-11 ENCOUNTER — Encounter (HOSPITAL_COMMUNITY): Payer: No Typology Code available for payment source

## 2016-11-13 ENCOUNTER — Encounter (HOSPITAL_COMMUNITY): Payer: No Typology Code available for payment source

## 2016-11-13 ENCOUNTER — Encounter (HOSPITAL_COMMUNITY): Admission: RE | Admit: 2016-11-13 | Payer: No Typology Code available for payment source | Source: Ambulatory Visit

## 2016-11-16 ENCOUNTER — Encounter (HOSPITAL_COMMUNITY): Payer: No Typology Code available for payment source

## 2016-11-18 ENCOUNTER — Encounter (HOSPITAL_COMMUNITY): Payer: No Typology Code available for payment source

## 2016-11-19 ENCOUNTER — Telehealth: Payer: Self-pay | Admitting: *Deleted

## 2016-11-19 NOTE — Telephone Encounter (Signed)
   [facility] Pre-operative Risk Assessment    Request for surgical clearance:  1. What type of surgery is being performed? TBD   2. When is this surgery scheduled? RIGHT EXTRACORPOREAL SHOCKWAVE LITHOTRIPSY     3. Are there any medications that need to be held prior to surgery and how long?BRILINTA 5 DAYS PRIOR  AND ASA 3 DAYS PRIOR  4. Practice name and name of physician performing surgery? DR Nicki Reaper [facility]    5. What is your office phone and fax number? [phone] FAX [phone]   6. Anesthesia type (None, local, MAC, general) ? UNKNOWN

## 2016-11-20 ENCOUNTER — Encounter (HOSPITAL_COMMUNITY): Payer: No Typology Code available for payment source

## 2016-11-20 NOTE — Telephone Encounter (Signed)
Would not dc brilinta or asa for 6 months and preferrably 1 yr from time of pci unless absolutely necessay; must continue asa regardless of timing of [place]

## 2016-11-20 NOTE — Telephone Encounter (Signed)
    Pt last seen by Dr Stanford Breed August, preop clearance not addressed.  Called pt. He is doing well, not requiring nitro with exercise (he walks regularly). He needs lithotripsy for kidney stones, has had it before.  Pain is not severe all the time, but he gets it at times.   His DES was in July.   Dr Stanford Breed to determine when he can come off the Brilinta for the procedure.   Lenoard Aden 11/20/2016 3:53 PM Beeper [phone]

## 2016-11-23 ENCOUNTER — Encounter (HOSPITAL_COMMUNITY): Payer: No Typology Code available for payment source

## 2016-11-23 NOTE — Telephone Encounter (Signed)
Sent to Dr Matilde Sprang

## 2016-11-25 ENCOUNTER — Encounter (HOSPITAL_COMMUNITY): Payer: No Typology Code available for payment source

## 2016-11-27 ENCOUNTER — Encounter (HOSPITAL_COMMUNITY): Payer: No Typology Code available for payment source

## 2016-11-30 ENCOUNTER — Encounter (HOSPITAL_COMMUNITY): Payer: No Typology Code available for payment source

## 2016-11-30 NOTE — Addendum Note (Signed)
Encounter addended by: Lowell Guitar, RN on: 11/30/2016  3:41 PM<BR>    Actions taken: Sign clinical note, Episode resolved

## 2016-11-30 NOTE — Progress Notes (Signed)
Discharge Progress Report  Patient Details  Name: Ricky Harrison MRN: [id number] Date of Birth: [birth year] Referring Provider:     [facility] from 09/29/2016 in [place]  Referring Provider  Kirk Ruths, MD.       Number of Visits: 13   Reason for Discharge:  Patient independent in their exercise.  Smoking History:  History  Smoking Status  . Never Smoker  Smokeless Tobacco  . Never Used    Diagnosis:  08/20/16 DES LAD  ADL UCSD:   Initial Exercise Prescription:     Initial Exercise Prescription - 09/29/16 1300      Date of Initial Exercise RX and Referring Provider   Date 09/29/16   Referring Provider Kirk Ruths, MD.     Treadmill   MPH 2.8   Grade 1   Minutes 10   METs 3.53     Bike   Level 1.4   Minutes 10   METs 4.65     NuStep   Level 4   SPM 90   Minutes 10   METs 3     Prescription Details   Frequency (times per week) 3   Duration Progress to 30 minutes of continuous aerobic without signs/symptoms of physical distress     Intensity   THRR 40-80% of Max Heartrate 64-129   Ratings of Perceived Exertion 11-13   Perceived Dyspnea 0-4     Progression   Progression Continue to progress workloads to maintain intensity without signs/symptoms of physical distress.     Resistance Training   Training Prescription Yes   Weight 4lbs   Reps 10-15      Discharge Exercise Prescription (Final Exercise Prescription Changes):     Exercise Prescription Changes - 10/27/16 1600      Response to Exercise   Blood Pressure (Admit) 126/82   Blood Pressure (Exercise) 144/80   Blood Pressure (Exit) 110/80   Heart Rate (Admit) 56 bpm   Heart Rate (Exercise) 94 bpm   Heart Rate (Exit) 65 bpm   Rating of Perceived Exertion (Exercise) 13   Symptoms none   Duration Progress to 30 minutes of  aerobic without signs/symptoms of physical distress   Intensity THRR unchanged     Progression    Progression Continue to progress workloads to maintain intensity without signs/symptoms of physical distress.   Average METs 4.1     Resistance Training   Training Prescription Yes   Weight 4lbs   Reps 10-15     Interval Training   Interval Training No     Treadmill   MPH 3.2   Grade 2   Minutes 10   METs 4.33     Bike   Level 1.3   Minutes 10   METs 4.38     NuStep   Level 5   SPM 90   Minutes 10   METs 3.5     Home Exercise Plan   Plans to continue exercise at Home (comment)   Frequency Add 4 additional days to program exercise sessions.   Initial Home Exercises Provided 10/14/16      Functional Capacity:     6 Minute Walk    Row Name 09/29/16 1340 10/28/16 0700       6 Minute Walk   Phase Initial Discharge    Distance 1933 feet 2075 feet    Distance % Change  - 7.35 %    Distance Feet Change  - 142 ft  Walk Time 6 minutes 6 minutes    # of Rest Breaks 0 0    MPH 3.66 3.93    METS 4.77 4.81    RPE 11 8    VO2 Peak 16.7 16.82    Symptoms No No    Resting HR 64 bpm 66 bpm    Resting BP 120/60 124/84    Max Ex. HR 82 bpm 84 bpm    Max Ex. BP 158/82 122/82    2 Minute Post BP 114/70  -       Psychological, QOL, Others - Outcomes: PHQ 2/9: Depression screen [facility] 2/9 10/05/2016 06/08/2016  Decreased Interest 0 0  Down, Depressed, Hopeless 0 0  PHQ - 2 Score 0 0  Altered sleeping - 0  Tired, decreased energy - 0  Change in appetite - 0  Feeling bad or failure about yourself  - 0  Trouble concentrating - 0  Moving slowly or fidgety/restless - 0  Suicidal thoughts - 0  PHQ-9 Score - 0    Quality of Life:     Quality of Life - 11/02/16 0728      Quality of Life Scores   Health/Function Pre 25 %   Health/Function Post 26.3 %   Health/Function % Change 5.2 %   Socioeconomic Pre 27 %   Socioeconomic Post 25.58 %   Socioeconomic % Change  -5.26 %   Psych/Spiritual Pre 26.4 %   Psych/Spiritual Post 30 %   Psych/Spiritual % Change 13.64  %   Family Pre 28.8 %   Family Post 28.8 %   Family % Change 0 %   GLOBAL Pre 26.3 %   GLOBAL Post 27.33 %   GLOBAL % Change 3.92 %      Personal Goals: Goals established at orientation with interventions provided to work toward goal.     Personal Goals and Risk Factors at Admission - 09/29/16 1332      Core Components/Risk Factors/Patient Goals on Admission   Hypertension Yes   Intervention Provide education on lifestyle modifcations including regular physical activity/exercise, weight management, moderate sodium restriction and increased consumption of fresh fruit, vegetables, and low fat dairy, alcohol moderation, and smoking cessation.;Monitor prescription use compliance.   Expected Outcomes Short Term: Continued assessment and intervention until BP is < 140/61mm HG in hypertensive participants. < 130/74mm HG in hypertensive participants with diabetes, heart failure or chronic kidney disease.;Long Term: Maintenance of blood pressure at goal levels.   Lipids Yes   Intervention Provide education and support for participant on nutrition & aerobic/resistive exercise along with prescribed medications to achieve LDL 70mg , HDL >40mg .   Expected Outcomes Short Term: Participant states understanding of desired cholesterol values and is compliant with medications prescribed. Participant is following exercise prescription and nutrition guidelines.;Long Term: Cholesterol controlled with medications as prescribed, with individualized exercise RX and with personalized nutrition plan. Value goals: LDL < 70mg , HDL > 40 mg.   Personal Goal Other Yes   Personal Goal Patient would like to complete 6 weeks of cardiac rehab and then return to exercise at local gym.   Intervention Patient will participate in cardiac rehab and be provided with an individual home exercise plan that he can continue independently once he completes the program.   Expected Outcomes Patient will return to exercise at his local gym  upon completion of cardiac rehab program.       Personal Goals Discharge:     Goals and Risk Factor Review    Row  Name 10/20/16 1628             Core Components/Risk Factors/Patient Goals Review   Personal Goals Review Hypertension;Lipids;Other       Review pt with mulitiple CAD RF demonstrates eagerness to participate in CR exercise, nutrition and education.         Expected Outcomes pt will participate in CR exercise, nutrition and and lifestyle modification for RF reducation.           Exercise Goals and Review:     Exercise Goals    Row Name 09/29/16 1341             Exercise Goals   Increase Physical Activity Yes       Intervention Provide advice, education, support and counseling about physical activity/exercise needs.;Develop an individualized exercise prescription for aerobic and resistive training based on initial evaluation findings, risk stratification, comorbidities and participant's personal goals.       Expected Outcomes Achievement of increased cardiorespiratory fitness and enhanced flexibility, muscular endurance and strength shown through measurements of functional capacity and personal statement of participant.       Increase Strength and Stamina Yes       Intervention Provide advice, education, support and counseling about physical activity/exercise needs.;Develop an individualized exercise prescription for aerobic and resistive training based on initial evaluation findings, risk stratification, comorbidities and participant's personal goals.       Expected Outcomes Achievement of increased cardiorespiratory fitness and enhanced flexibility, muscular endurance and strength shown through measurements of functional capacity and personal statement of participant.          Nutrition & Weight - Outcomes:     Pre Biometrics - 09/29/16 1026      Pre Biometrics   Height 5' 7.5" (1.715 m)   Weight 159 lb 6.3 oz (72.3 kg)   Waist Circumference 34.5 inches    Hip Circumference 37.25 inches   Waist to Hip Ratio 0.93 %   BMI (Calculated) 24.58   Triceps Skinfold 14 mm   % Body Fat 23.3 %   Grip Strength 43 kg   Flexibility 8 in   Single Leg Stand 30 seconds         Post Biometrics - 11/04/16 0651       Post  Biometrics   Height 5' 7.5" (1.715 m)   Weight 160 lb 11.5 oz (72.9 kg)   Waist Circumference 34.5 inches   Hip Circumference 38 inches   Waist to Hip Ratio 0.91 %   BMI (Calculated) 24.79   Triceps Skinfold 12 mm   % Body Fat 22.7 %   Grip Strength 46 kg   Flexibility 8 in   Single Leg Stand 30 seconds      Nutrition:   Nutrition Discharge:     Nutrition Assessments - 11/27/16 0922      MEDFICTS Scores   Pre Score 15   Post Score 15   Score Difference 0      Education Questionnaire Score:     Knowledge Questionnaire Score - 11/02/16 0728      Knowledge Questionnaire Score   Pre Score 20/24   Post Score 23/24      Goals reviewed with patient; copy given to patient.

## 2016-12-02 ENCOUNTER — Encounter (HOSPITAL_COMMUNITY): Payer: No Typology Code available for payment source

## 2016-12-04 ENCOUNTER — Encounter (HOSPITAL_COMMUNITY): Payer: No Typology Code available for payment source

## 2016-12-07 ENCOUNTER — Encounter (HOSPITAL_COMMUNITY): Payer: No Typology Code available for payment source

## 2016-12-09 ENCOUNTER — Encounter (HOSPITAL_COMMUNITY): Payer: No Typology Code available for payment source

## 2016-12-11 ENCOUNTER — Encounter (HOSPITAL_COMMUNITY): Payer: No Typology Code available for payment source

## 2016-12-14 ENCOUNTER — Encounter (HOSPITAL_COMMUNITY): Payer: No Typology Code available for payment source

## 2016-12-16 ENCOUNTER — Encounter (HOSPITAL_COMMUNITY): Payer: No Typology Code available for payment source

## 2016-12-18 ENCOUNTER — Encounter (HOSPITAL_COMMUNITY): Payer: No Typology Code available for payment source

## 2016-12-21 ENCOUNTER — Encounter (HOSPITAL_COMMUNITY): Payer: No Typology Code available for payment source

## 2016-12-23 ENCOUNTER — Encounter (HOSPITAL_COMMUNITY): Payer: No Typology Code available for payment source

## 2016-12-25 ENCOUNTER — Encounter (HOSPITAL_COMMUNITY): Payer: No Typology Code available for payment source

## 2016-12-28 ENCOUNTER — Encounter (HOSPITAL_COMMUNITY): Payer: No Typology Code available for payment source

## 2016-12-30 ENCOUNTER — Encounter (HOSPITAL_COMMUNITY): Payer: No Typology Code available for payment source

## 2017-01-01 ENCOUNTER — Encounter (HOSPITAL_COMMUNITY): Payer: No Typology Code available for payment source

## 2017-01-04 ENCOUNTER — Encounter (HOSPITAL_COMMUNITY): Payer: No Typology Code available for payment source

## 2017-01-06 ENCOUNTER — Encounter (HOSPITAL_COMMUNITY): Payer: No Typology Code available for payment source

## 2017-01-07 ENCOUNTER — Telehealth: Payer: Self-pay | Admitting: Cardiology

## 2017-01-07 NOTE — Telephone Encounter (Signed)
New message   Pt wife verbalized that she is calling for rn   Pt has a really bad cold his is taking tylenol and cold and flu medication she   want to know what is good and safe for him to take with his heart medications

## 2017-01-07 NOTE — Telephone Encounter (Signed)
Returned the call to the patient. He stated that he has been dealing with a bad cold for a week and wondered what he could take. He has been advised that he may take tylenol and mucinex. The mucinex should not be longer than 3 days. He has also been advised to see his PCP for his symptoms since he does not seem to be getting better.

## 2017-02-03 DIAGNOSIS — J189 Pneumonia, unspecified organism: Secondary | ICD-10-CM | POA: Insufficient documentation

## 2017-02-03 DIAGNOSIS — K4090 Unilateral inguinal hernia, without obstruction or gangrene, not specified as recurrent: Secondary | ICD-10-CM | POA: Insufficient documentation

## 2017-02-03 DIAGNOSIS — Z87442 Personal history of urinary calculi: Secondary | ICD-10-CM | POA: Insufficient documentation

## 2017-02-03 DIAGNOSIS — I10 Essential (primary) hypertension: Secondary | ICD-10-CM | POA: Insufficient documentation

## 2017-02-03 DIAGNOSIS — G4730 Sleep apnea, unspecified: Secondary | ICD-10-CM | POA: Insufficient documentation

## 2017-02-07 ENCOUNTER — Other Ambulatory Visit: Payer: Self-pay | Admitting: Cardiology

## 2017-02-17 NOTE — Progress Notes (Signed)
HPI: Follow-up coronary artery disease. Patient seen in July 2018 with symptoms concerning for unstable angina. Cardiac catheterization July 2018 showed ejection fraction 45-50%, occluded LAD and no other obstructive disease noted. Patient had drug-eluting stent to his LAD at that time. Since last seen, patient denies any dyspnea, chest pain or palpitations or syncope.  Current Outpatient Medications  Medication Sig Dispense Refill  . aspirin 81 MG chewable tablet Chew 1 tablet (81 mg total) by mouth daily.    Marland Kitchen atorvastatin (LIPITOR) 80 MG tablet TAKE 1 TABLET (80 MG TOTAL) BY MOUTH DAILY AT 6 PM. 30 tablet 5  . lisinopril (PRINIVIL,ZESTRIL) 5 MG tablet TAKE 1 TABLET BY MOUTH EVERY DAY 30 tablet 5  . metoprolol tartrate (LOPRESSOR) 25 MG tablet Take 0.5 tablets (12.5 mg total) by mouth 2 (two) times daily. 60 tablet 5  . Multiple Vitamin (MULTIVITAMIN WITH MINERALS) TABS Take 1 tablet by mouth daily. Takes on Mondays.    . nitroGLYCERIN (NITROSTAT) 0.4 MG SL tablet Place 1 tablet (0.4 mg total) under the tongue every 5 (five) minutes as needed for chest pain. 25 tablet 2  . ticagrelor (BRILINTA) 90 MG TABS tablet Take 1 tablet (90 mg total) by mouth 2 (two) times daily. 180 tablet 3   No current facility-administered medications for this visit.      Past Medical History:  Diagnosis Date  . Basal cell carcinoma of face 2006  . History of kidney stones   . Hypertension   . Inguinal hernia, right    Dr. Dalbert Batman; "still flares up sometimes" (08/19/2016)  . Pneumonia ~ 1979   "walking"  . Sleep apnea    "had mask; couldn't tolerate it; I usually use nasal strips now" (08/19/2016)    Past Surgical History:  Procedure Laterality Date  . CORONARY STENT INTERVENTION N/A 08/20/2016   Procedure: Coronary Stent Intervention;  Surgeon: Jettie Booze, MD;  Location: [facility];  Service: Cardiovascular;  Laterality: N/A;  . CYSTOSCOPY W/ STONE MANIPULATION  X 2  . INGUINAL  HERNIA REPAIR  01/01/2012   Procedure: LAPAROSCOPIC INGUINAL HERNIA;  Surgeon: Adin Hector, MD;  Location: [facility];  Service: General;  Laterality: Left;  . INGUINAL HERNIA REPAIR Right 2016   Dr. Dalbert Batman  . INGUINAL HERNIA REPAIR Left   . INSERTION OF MESH  01/01/2012   Procedure: INSERTION OF MESH;  Surgeon: Adin Hector, MD;  Location: [facility];  Service: General;  Laterality: Left;  . LEFT HEART CATH AND CORONARY ANGIOGRAPHY N/A 08/20/2016   Procedure: Left Heart Cath and Coronary Angiography;  Surgeon: Jettie Booze, MD;  Location: [facility];  Service: Cardiovascular;  Laterality: N/A;  . LITHOTRIPSY  2008   5 times total - Dr. Risa Grill  . LITHOTRIPSY  X ~ 5  . MOHS SURGERY  2006   basal cell; face  . NASAL SEPTUM SURGERY  2004   Dr. Wilburn Cornelia    Social History   Socioeconomic History  . Marital status: Married    Spouse name: Not on file  . Number of children: 2  . Years of education: Not on file  . Highest education level: Not on file  Social Needs  . Financial resource strain: Not on file  . Food insecurity - worry: Not on file  . Food insecurity - inability: Not on file  . Transportation needs - medical: Not on file  . Transportation needs - non-medical: Not on file  Occupational History  .  Not on file  Tobacco Use  . Smoking status: Never Smoker  . Smokeless tobacco: Never Used  Substance and Sexual Activity  . Alcohol use: Yes    Alcohol/week: 3.0 oz    Types: 5 Shots of liquor per week  . Drug use: No  . Sexual activity: Yes  Other Topics Concern  . Not on file  Social History Narrative  . Not on file    Family History  Problem Relation Age of Onset  . Cancer Mother        throat  . Heart attack Father 63    ROS: no fevers or chills, productive cough, hemoptysis, dysphasia, odynophagia, melena, hematochezia, dysuria, hematuria, rash, seizure activity, orthopnea, PND, pedal edema, claudication. Remaining systems are  negative.  Physical Exam: Well-developed well-nourished in no acute distress.  Skin is warm and dry.  HEENT is normal.  Neck is supple.  Chest is clear to auscultation with normal expansion.  Cardiovascular exam is regular rate and rhythm.  Abdominal exam nontender or distended. No masses palpated. Extremities show no edema. neuro grossly intact  ECG- personally reviewed  A/P  1 coronary artery disease-patient doing well with no chest pain. Continue aspirin and statin. We will continue with Brilinta until August 2019. Discontinue at that time.  2 history of mild ischemic cardiomyopathy-continue beta blocker and ACE inhibitor. Note he has had a mild cough since a recent URI. If it does not improve we will discontinue ACE inhibitor and treat with ARB.  3 hypertension-blood pressure is controlled. Continue present medications.  4 hyperlipidemia-continue statin.  Kirk Ruths, MD

## 2017-02-24 ENCOUNTER — Encounter: Payer: Self-pay | Admitting: Cardiology

## 2017-02-24 ENCOUNTER — Ambulatory Visit (INDEPENDENT_AMBULATORY_CARE_PROVIDER_SITE_OTHER): Payer: 59 | Admitting: Cardiology

## 2017-02-24 ENCOUNTER — Encounter (INDEPENDENT_AMBULATORY_CARE_PROVIDER_SITE_OTHER): Payer: Self-pay

## 2017-02-24 VITALS — BP 129/74 | HR 59 | Ht 67.5 in | Wt 165.4 lb

## 2017-02-24 DIAGNOSIS — E7800 Pure hypercholesterolemia, unspecified: Secondary | ICD-10-CM

## 2017-02-24 DIAGNOSIS — I2510 Atherosclerotic heart disease of native coronary artery without angina pectoris: Secondary | ICD-10-CM

## 2017-02-24 DIAGNOSIS — I10 Essential (primary) hypertension: Secondary | ICD-10-CM | POA: Diagnosis not present

## 2017-02-24 NOTE — Patient Instructions (Signed)
Medication Instructions:   STOP BRILINTA THE FIRST OF AUGUST 2019  Follow-Up:  Your physician wants you to follow-up in: [place] will receive a reminder letter in the mail two months in advance. If you don't receive a letter, please call our office to schedule the follow-up appointment.   If you need a refill on your cardiac medications before your next appointment, please call your pharmacy.

## 2017-03-19 ENCOUNTER — Telehealth: Payer: Self-pay | Admitting: Cardiology

## 2017-03-19 MED ORDER — LOSARTAN POTASSIUM 50 MG PO TABS: 50.0000 mg | ORAL_TABLET | Freq: Every day | ORAL | 3 refills | Status: DC

## 2017-03-19 NOTE — Telephone Encounter (Signed)
Returned call to patient concerning symptoms of a cough, scratchy throat, sneezing. He has been on this medication since late July but symptoms had been going on for about 3 months. He has no SOB. His symptoms occur 7-8x/day. Will defer to MD to review symptoms and advise on med change if appropriate.

## 2017-03-19 NOTE — Telephone Encounter (Signed)
New Message   Pt c/o medication issue:  1. Name of Medication: lisinopril  2. How are you currently taking this medication (dosage and times per day)? 5mg  once a day   3. Are you having a reaction (difficulty breathing--STAT)? No breathing difficulties 4. What is your medication issue? Patient states that he is having a increase in coughing with a scratchy throat since starting this medication. It happens between 7-8 times a day.

## 2017-03-19 NOTE — Telephone Encounter (Signed)
DC lisinopril; cozaar 50 mg daily Kirk Ruths

## 2017-03-19 NOTE — Telephone Encounter (Signed)
Spoke with pt, New script sent to the pharmacy  

## 2017-06-16 ENCOUNTER — Telehealth: Payer: Self-pay

## 2017-06-16 NOTE — Telephone Encounter (Signed)
Pt given 2 weeks samples of Brilinta 90 mg and a copay card. Pt having issues with payment as went from $75/month to $337 until he reaches his deductable. Spoke with Patty at pharmacy she stated she instructed pt to contact his insurance as she is not sure what has changed. She did state he has not ever used a copay card, so one was given to pt. Pt also advised to call insurance to figure out any changes in coverage. Pt instructed to call our office if he starts to get low and the copay card is not helpful past one month. Pt stated he just needs to get to August when he can stop taking the medication.  Pt has no additional questions at this time.

## 2017-07-13 ENCOUNTER — Other Ambulatory Visit: Payer: Self-pay | Admitting: Cardiology

## 2017-07-19 ENCOUNTER — Telehealth: Payer: Self-pay | Admitting: Cardiology

## 2017-07-19 NOTE — Telephone Encounter (Signed)
Pt's wife Ricky Harrison calling   Pt is taking Brilanta and has  6 more weeks to take it. The pharmacy told pt that is will cost $180 after coupons for the pt to continue to take it. Pt's wife want to know if there are any suggestions to cut down the cost. please call to advise.

## 2017-07-19 NOTE — Telephone Encounter (Signed)
Spoke with pt wife, even with the commerical card from [employer] the patient would have to pay $180. He is stopping the brilinta in august, samples provided.

## 2017-08-02 ENCOUNTER — Other Ambulatory Visit: Payer: Self-pay | Admitting: Cardiology

## 2017-08-02 NOTE — Telephone Encounter (Signed)
Rx request sent to pharmacy.  

## 2017-08-04 ENCOUNTER — Telehealth: Payer: Self-pay | Admitting: Cardiology

## 2017-08-04 NOTE — Telephone Encounter (Signed)
New Message:      Pt is requesting samples of ticagrelor (BRILINTA) 90 MG TABS tablet

## 2017-08-04 NOTE — Telephone Encounter (Signed)
Pt's wife aware Briilinta 90 mg samples left at front desk .Adonis Housekeeper

## 2017-08-17 ENCOUNTER — Telehealth: Payer: Self-pay | Admitting: Cardiology

## 2017-08-17 NOTE — Telephone Encounter (Signed)
[place]

## 2017-08-17 NOTE — Telephone Encounter (Addendum)
Patient walked in requesting Brilinta samples  He states he has a co-pay card but it was originally costing him $75 and it is now more. He states he was originally told by MD he would need to take Brilinta until August 1 but he is 1 year post stent as on August 20, 2017. Will ask MD if he can stop as of July 12. He is not having chest pain or SOB  Samples provided: Brilinta 90mg , 3 bottles, Lot: [id number], Exp: 03/2020

## 2017-08-18 NOTE — Telephone Encounter (Signed)
Patient called with MD recommendation to [facility].

## 2018-01-21 ENCOUNTER — Other Ambulatory Visit: Payer: Self-pay | Admitting: Cardiology

## 2018-01-21 NOTE — Telephone Encounter (Signed)
Rx request sent to pharmacy.  

## 2018-01-29 ENCOUNTER — Other Ambulatory Visit: Payer: Self-pay | Admitting: Cardiology

## 2018-02-22 NOTE — Progress Notes (Signed)
HPI: Follow-up coronary artery disease. Patient seen in July 2018 with symptoms concerning for unstable angina. Cardiac catheterization July 2018 showed ejection fraction 45-50%, occluded LAD and no other obstructive disease noted. Patient had drug-eluting stent to his LAD at that time. Since last seen,the patient denies any dyspnea on exertion, orthopnea, PND, pedal edema, palpitations, syncope or chest pain.   Current Outpatient Medications  Medication Sig Dispense Refill  . aspirin 81 MG chewable tablet Chew 1 tablet (81 mg total) by mouth daily.    Marland Kitchen atorvastatin (LIPITOR) 80 MG tablet TAKE 1 TABLET (80 MG TOTAL) BY MOUTH DAILY AT 6 PM. 90 tablet 1  . losartan (COZAAR) 100 MG tablet TAKE ONE-HALF TABLET BY MOUTH EVERY DAY 45 tablet 0  . metoprolol tartrate (LOPRESSOR) 25 MG tablet TAKE ONE-HALF TABLET (12.5 MG TOTAL) BY MOUTH 2 (TWO) TIMES DAILY. 60 tablet 8  . Multiple Vitamin (MULTIVITAMIN WITH MINERALS) TABS Take 1 tablet by mouth daily. Takes on Mondays.    . nitroGLYCERIN (NITROSTAT) 0.4 MG SL tablet Place 1 tablet (0.4 mg total) under the tongue every 5 (five) minutes as needed for chest pain. 25 tablet 2   No current facility-administered medications for this visit.      Past Medical History:  Diagnosis Date  . Basal cell carcinoma of face 2006  . History of kidney stones   . Hypertension   . Inguinal hernia, right    Dr. Dalbert Batman; "still flares up sometimes" (08/19/2016)  . Pneumonia ~ 1979   "walking"  . Sleep apnea    "had mask; couldn't tolerate it; I usually use nasal strips now" (08/19/2016)    Past Surgical History:  Procedure Laterality Date  . CORONARY STENT INTERVENTION N/A 08/20/2016   Procedure: Coronary Stent Intervention;  Surgeon: Jettie Booze, MD;  Location: [facility];  Service: Cardiovascular;  Laterality: N/A;  . CYSTOSCOPY W/ STONE MANIPULATION  X 2  . INGUINAL HERNIA REPAIR  01/01/2012   Procedure: LAPAROSCOPIC INGUINAL HERNIA;   Surgeon: Adin Hector, MD;  Location: [facility];  Service: General;  Laterality: Left;  . INGUINAL HERNIA REPAIR Right 2016   Dr. Dalbert Batman  . INGUINAL HERNIA REPAIR Left   . INSERTION OF MESH  01/01/2012   Procedure: INSERTION OF MESH;  Surgeon: Adin Hector, MD;  Location: [facility];  Service: General;  Laterality: Left;  . LEFT HEART CATH AND CORONARY ANGIOGRAPHY N/A 08/20/2016   Procedure: Left Heart Cath and Coronary Angiography;  Surgeon: Jettie Booze, MD;  Location: [facility];  Service: Cardiovascular;  Laterality: N/A;  . LITHOTRIPSY  2008   5 times total - Dr. Risa Grill  . LITHOTRIPSY  X ~ 5  . MOHS SURGERY  2006   basal cell; face  . NASAL SEPTUM SURGERY  2004   Dr. Wilburn Cornelia    Social History   Socioeconomic History  . Marital status: Married    Spouse name: Not on file  . Number of children: 2  . Years of education: Not on file  . Highest education level: Not on file  Occupational History  . Not on file  Social Needs  . Financial resource strain: Not on file  . Food insecurity:    Worry: Not on file    Inability: Not on file  . Transportation needs:    Medical: Not on file    Non-medical: Not on file  Tobacco Use  . Smoking status: Never Smoker  . Smokeless  tobacco: Never Used  Substance and Sexual Activity  . Alcohol use: Yes    Alcohol/week: 5.0 standard drinks    Types: 5 Shots of liquor per week  . Drug use: No  . Sexual activity: Yes  Lifestyle  . Physical activity:    Days per week: Not on file    Minutes per session: Not on file  . Stress: Not on file  Relationships  . Social connections:    Talks on phone: Not on file    Gets together: Not on file    Attends religious service: Not on file    Active member of club or organization: Not on file    Attends meetings of clubs or organizations: Not on file    Relationship status: Not on file  . Intimate partner violence:    Fear of current or ex partner: Not on file    Emotionally  abused: Not on file    Physically abused: Not on file    Forced sexual activity: Not on file  Other Topics Concern  . Not on file  Social History Narrative  . Not on file    Family History  Problem Relation Age of Onset  . Cancer Mother        throat  . Heart attack Father 74    ROS: no fevers or chills, productive cough, hemoptysis, dysphasia, odynophagia, melena, hematochezia, dysuria, hematuria, rash, seizure activity, orthopnea, PND, pedal edema, claudication. Remaining systems are negative.  Physical Exam: Well-developed well-nourished in no acute distress.  Skin is warm and dry.  HEENT is normal.  Neck is supple.  Chest is clear to auscultation with normal expansion.  Cardiovascular exam is regular rate and rhythm.  Abdominal exam nontender or distended. No masses palpated. Extremities show no edema. neuro grossly intact  ECG-sinus bradycardia at a rate of 55, no ST changes.  Personally reviewed  A/P  1 coronary artery disease-patient denies chest pain.  Plan to continue medical therapy with aspirin and statin.  Brilinta has been discontinued.  2 hypertension-patient's blood pressure is mildly elevated and he states his systolic runs [phone] at home.  Add hydrochlorothiazide 12.5 mg daily.  Check potassium and renal function in 1 week.  3 hyperlipidemia-continue statin.  Check lipids and liver.  4 history of mild nonischemic cardiomyopathy-continue beta-blocker and ARB.  Kirk Ruths, MD

## 2018-02-25 ENCOUNTER — Encounter: Payer: Self-pay | Admitting: Cardiology

## 2018-02-25 ENCOUNTER — Ambulatory Visit (INDEPENDENT_AMBULATORY_CARE_PROVIDER_SITE_OTHER): Payer: 59 | Admitting: Cardiology

## 2018-02-25 VITALS — BP 138/80 | HR 55 | Ht 67.5 in | Wt 178.0 lb

## 2018-02-25 DIAGNOSIS — E7800 Pure hypercholesterolemia, unspecified: Secondary | ICD-10-CM

## 2018-02-25 DIAGNOSIS — I10 Essential (primary) hypertension: Secondary | ICD-10-CM

## 2018-02-25 DIAGNOSIS — I2510 Atherosclerotic heart disease of native coronary artery without angina pectoris: Secondary | ICD-10-CM

## 2018-02-25 MED ORDER — HYDROCHLOROTHIAZIDE 12.5 MG PO CAPS: 12.5000 mg | ORAL_CAPSULE | Freq: Every day | ORAL | 3 refills | Status: DC

## 2018-02-25 NOTE — Patient Instructions (Signed)
Medication Instructions:  START HCTZ 12.5 MG ONCE DAILY If you need a refill on your cardiac medications before your next appointment, please call your pharmacy.   Lab work: Your physician recommends that you return for lab work in: [place] If you have labs (blood work) drawn today and your tests are completely normal, you will receive your results only by: Marland Kitchen MyChart Message (if you have MyChart) OR . A paper copy in the mail If you have any lab test that is abnormal or we need to change your treatment, we will call you to review the results.  Follow-Up: At [facility], you and your health needs are our priority.  As part of our continuing mission to provide you with exceptional heart care, we have created designated Provider Care Teams.  These Care Teams include your primary Cardiologist (physician) and Advanced Practice Providers (APPs -  Physician Assistants and Nurse Practitioners) who all work together to provide you with the care you need, when you need it. You will need a follow up appointment in 12 months.  Please call our office 2 months in advance to schedule this appointment.  You may see Kirk Ruths, MD or one of the following Advanced Practice Providers on your designated Care Team:   Kerin Ransom, PA-C Roby Lofts, [place] . Sande Rives, [place]  CALL IN November TO SCHEDULE APPOINTMENT IN January 2021

## 2018-03-04 ENCOUNTER — Encounter: Payer: Self-pay | Admitting: *Deleted

## 2018-03-04 LAB — HEPATIC FUNCTION PANEL
ALT: 47 IU/L — ABNORMAL HIGH (ref 0–44)
AST: 26 IU/L (ref 0–40)
Albumin: 4.7 g/dL (ref 3.8–4.9)
Alkaline Phosphatase: 67 IU/L (ref 39–117)
BILIRUBIN, DIRECT: 0.2 mg/dL (ref 0.00–0.40)
Bilirubin Total: 0.9 mg/dL (ref 0.0–1.2)
Total Protein: 6.6 g/dL (ref 6.0–8.5)

## 2018-03-04 LAB — BASIC METABOLIC PANEL
BUN/Creatinine Ratio: 16 (ref 10–24)
BUN: 20 mg/dL (ref 8–27)
CALCIUM: 9.8 mg/dL (ref 8.6–10.2)
CO2: 24 mmol/L (ref 20–29)
Chloride: 98 mmol/L (ref 96–106)
Creatinine, Ser: 1.23 mg/dL (ref 0.76–1.27)
GFR calc Af Amer: 73 mL/min/{1.73_m2} (ref >59)
GFR calc non Af Amer: 63 mL/min/{1.73_m2} (ref >59)
Glucose: 99 mg/dL (ref 65–99)
Potassium: 4.6 mmol/L (ref 3.5–5.2)
Sodium: 138 mmol/L (ref 134–144)

## 2018-03-04 LAB — LIPID PANEL
Chol/HDL Ratio: 4.2 ratio (ref 0.0–5.0)
Cholesterol, Total: 148 mg/dL (ref 100–199)
HDL: 35 mg/dL — AB (ref >39)
TRIGLYCERIDES: 405 mg/dL — AB (ref 0–149)

## 2018-03-18 ENCOUNTER — Other Ambulatory Visit: Payer: Self-pay | Admitting: Cardiology

## 2018-06-11 ENCOUNTER — Other Ambulatory Visit: Payer: Self-pay | Admitting: Cardiology

## 2018-06-11 NOTE — Telephone Encounter (Signed)
Losartan 50 mg refilled. 

## 2018-06-28 ENCOUNTER — Ambulatory Visit (INDEPENDENT_AMBULATORY_CARE_PROVIDER_SITE_OTHER): Payer: 59 | Admitting: Family Medicine

## 2018-06-28 ENCOUNTER — Encounter: Payer: Self-pay | Admitting: Family Medicine

## 2018-06-28 ENCOUNTER — Other Ambulatory Visit: Payer: Self-pay

## 2018-06-28 DIAGNOSIS — H938X3 Other specified disorders of ear, bilateral: Secondary | ICD-10-CM | POA: Diagnosis not present

## 2018-06-28 NOTE — Progress Notes (Signed)
CC: Clogged ears  Subjective: Patient is a 63 y.o. male here for clogged ears. Due to COVID-19 pandemic, we are interacting via web portal for an electronic face-to-face visit. I verified patient's ID using 2 identifiers. Patient agreed to proceed with visit via this method. Patient is at work, I am at home. Patient and I are present for visit.   Over past 2 years, pt feels his hearing has been declining. Got particularly bad over past several weeks according to fam members. They can be standing 6-8 ft away and he cannot here them. He will sometimes hear popping in his ear. He used OTC debrox that did not help, but he felt a lot of bubbling/popping. No pain, drainage, fevers, head inj or loud noise exposure. Nml audiologic exam 9 yrs ago. He does not use Q tips other than outside his ear. He does have some congestion/proclivity to allergies.  ROS: HEENT: As noted in HPI  Past Medical History:  Diagnosis Date  . Basal cell carcinoma of face 2006  . History of kidney stones   . Hypertension   . Inguinal hernia, right    Dr. Dalbert Batman; "still flares up sometimes" (08/19/2016)  . Pneumonia ~ 1979   "walking"  . Sleep apnea    "had mask; couldn't tolerate it; I usually use nasal strips now" (08/19/2016)    Objective: No conversational dyspnea Age appropriate judgment and insight Nml affect and mood Hearing grossly intact  Assessment and Plan: Sensation of fullness in both ears  Will having him come in for nurse visit tomorrow, planning on wax removal. If nothing there, will refer him to audiology.  The patient voiced understanding and agreement to the plan.  [place], DO 06/28/18  1:53 PM

## 2018-06-29 ENCOUNTER — Ambulatory Visit: Payer: 59

## 2018-06-29 ENCOUNTER — Other Ambulatory Visit: Payer: Self-pay

## 2018-06-29 ENCOUNTER — Other Ambulatory Visit: Payer: Self-pay | Admitting: Family Medicine

## 2018-06-29 DIAGNOSIS — H9193 Unspecified hearing loss, bilateral: Secondary | ICD-10-CM

## 2018-06-29 DIAGNOSIS — H833X9 Noise effects on inner ear, unspecified ear: Secondary | ICD-10-CM

## 2018-06-29 NOTE — Progress Notes (Signed)
Patient was here for Ear irrigation and all went well.

## 2018-06-29 NOTE — Progress Notes (Signed)
am

## 2018-07-21 ENCOUNTER — Other Ambulatory Visit: Payer: Self-pay | Admitting: Cardiology

## 2018-08-28 ENCOUNTER — Other Ambulatory Visit: Payer: Self-pay | Admitting: Cardiology

## 2018-10-21 ENCOUNTER — Other Ambulatory Visit: Payer: Self-pay | Admitting: Cardiology

## 2019-02-14 ENCOUNTER — Other Ambulatory Visit: Payer: Self-pay | Admitting: Cardiology

## 2019-02-14 NOTE — Telephone Encounter (Signed)
Rx has been sent to the pharmacy electronically. ° °

## 2019-02-26 ENCOUNTER — Other Ambulatory Visit: Payer: Self-pay | Admitting: Cardiology

## 2019-03-03 NOTE — Progress Notes (Signed)
HPI: Follow-up coronary artery disease. Patient seen in July 2018 with symptoms concerning for unstable angina. Cardiac catheterization July 2018 showed ejection fraction 45-50%, occluded LAD and no other obstructive disease noted. Patient had drug-eluting stent to his LAD at that time. Since last seen, the patient denies any dyspnea on exertion, orthopnea, PND, pedal edema, palpitations, syncope or chest pain.   Current Outpatient Medications  Medication Sig Dispense Refill  . aspirin 81 MG chewable tablet Chew 1 tablet (81 mg total) by mouth daily.    Marland Kitchen atorvastatin (LIPITOR) 80 MG tablet TAKE 1 TABLET (80 MG TOTAL) BY MOUTH DAILY AT 6 PM. 30 tablet 0  . hydrochlorothiazide (MICROZIDE) 12.5 MG capsule TAKE 1 CAPSULE BY MOUTH EVERY DAY 90 capsule 0  . losartan (COZAAR) 50 MG tablet TAKE 1 TABLET EVERY DAY 90 tablet 3  . metoprolol tartrate (LOPRESSOR) 25 MG tablet TAKE ONE-HALF TABLET (12.5 MG TOTAL) BY MOUTH 2 (TWO) TIMES DAILY. 90 tablet 2  . Multiple Vitamin (MULTIVITAMIN WITH MINERALS) TABS Take 1 tablet by mouth daily. Takes on Mondays.    . nitroGLYCERIN (NITROSTAT) 0.4 MG SL tablet Place 1 tablet (0.4 mg total) under the tongue every 5 (five) minutes as needed for chest pain. 25 tablet 2   No current facility-administered medications for this visit.     Past Medical History:  Diagnosis Date  . Basal cell carcinoma of face 2006  . History of kidney stones   . Hypertension   . Inguinal hernia, right    Dr. Dalbert Batman; "still flares up sometimes" (08/19/2016)  . Pneumonia ~ 1979   "walking"  . Sleep apnea    "had mask; couldn't tolerate it; I usually use nasal strips now" (08/19/2016)    Past Surgical History:  Procedure Laterality Date  . CORONARY STENT INTERVENTION N/A 08/20/2016   Procedure: Coronary Stent Intervention;  Surgeon: Jettie Booze, MD;  Location: [place] [facility];  Service: Cardiovascular;  Laterality: N/A;  . CYSTOSCOPY W/ STONE MANIPULATION  X 2    . INGUINAL HERNIA REPAIR  01/01/2012   Procedure: LAPAROSCOPIC INGUINAL HERNIA;  Surgeon: Adin Hector, MD;  Location: [facility];  Service: General;  Laterality: Left;  . INGUINAL HERNIA REPAIR Right 2016   Dr. Dalbert Batman  . INGUINAL HERNIA REPAIR Left   . INSERTION OF MESH  01/01/2012   Procedure: INSERTION OF MESH;  Surgeon: Adin Hector, MD;  Location: [facility];  Service: General;  Laterality: Left;  . LEFT HEART CATH AND CORONARY ANGIOGRAPHY N/A 08/20/2016   Procedure: Left Heart Cath and Coronary Angiography;  Surgeon: Jettie Booze, MD;  Location: [place] [facility];  Service: Cardiovascular;  Laterality: N/A;  . LITHOTRIPSY  2008   5 times total - Dr. Risa Grill  . LITHOTRIPSY  X ~ 5  . MOHS SURGERY  2006   basal cell; face  . NASAL SEPTUM SURGERY  2004   Dr. Wilburn Cornelia    Social History   Socioeconomic History  . Marital status: Married    Spouse name: Not on file  . Number of children: 2  . Years of education: Not on file  . Highest education level: Not on file  Occupational History  . Not on file  Tobacco Use  . Smoking status: Never Smoker  . Smokeless tobacco: Never Used  Substance and Sexual Activity  . Alcohol use: Yes    Alcohol/week: 5.0 standard drinks    Types: 5 Shots of liquor per week  .  Drug use: No  . Sexual activity: Yes  Other Topics Concern  . Not on file  Social History Narrative  . Not on file   Social Determinants of Health   Financial Resource Strain:   . Difficulty of Paying Living Expenses: Not on file  Food Insecurity:   . Worried About Charity fundraiser in the Last Year: Not on file  . Ran Out of Food in the Last Year: Not on file  Transportation Needs:   . Lack of Transportation (Medical): Not on file  . Lack of Transportation (Non-Medical): Not on file  Physical Activity:   . Days of Exercise per Week: Not on file  . Minutes of Exercise per Session: Not on file  Stress:   . Feeling of Stress : Not on file  Social  Connections:   . Frequency of Communication with Friends and Family: Not on file  . Frequency of Social Gatherings with Friends and Family: Not on file  . Attends Religious Services: Not on file  . Active Member of Clubs or Organizations: Not on file  . Attends Archivist Meetings: Not on file  . Marital Status: Not on file  Intimate Partner Violence:   . Fear of Current or Ex-Partner: Not on file  . Emotionally Abused: Not on file  . Physically Abused: Not on file  . Sexually Abused: Not on file    Family History  Problem Relation Age of Onset  . Cancer Mother        throat  . Heart attack Father 83    ROS: no fevers or chills, productive cough, hemoptysis, dysphasia, odynophagia, melena, hematochezia, dysuria, hematuria, rash, seizure activity, orthopnea, PND, pedal edema, claudication. Remaining systems are negative.  Physical Exam: Well-developed well-nourished in no acute distress.  Skin is warm and dry.  HEENT is normal.  Neck is supple.  Chest is clear to auscultation with normal expansion.  Cardiovascular exam is regular rate and rhythm.  Abdominal exam nontender or distended. No masses palpated. Extremities show no edema. neuro grossly intact  ECG-sinus rhythm at a rate of 62, no ST changes.  Personally reviewed  A/P  1 coronary artery disease-patient doing well with no chest pain.  Continue aspirin and statin.  2 hypertension-blood pressure controlled.  Continue present medications and follow.  Check potassium and renal function.  3 hyperlipidemia-continue statin.  Check lipids and liver.  4 history of nonischemic cardiomyopathy-Continue beta-blocker and ARB.  Check echocardiogram for LV function.  Kirk Ruths, MD

## 2019-03-07 ENCOUNTER — Other Ambulatory Visit: Payer: Self-pay

## 2019-03-07 ENCOUNTER — Ambulatory Visit: Payer: BC Managed Care – PPO | Admitting: Cardiology

## 2019-03-07 ENCOUNTER — Encounter (INDEPENDENT_AMBULATORY_CARE_PROVIDER_SITE_OTHER): Payer: Self-pay

## 2019-03-07 ENCOUNTER — Encounter: Payer: Self-pay | Admitting: Cardiology

## 2019-03-07 VITALS — BP 126/78 | HR 62 | Ht 68.0 in | Wt 175.2 lb

## 2019-03-07 DIAGNOSIS — I10 Essential (primary) hypertension: Secondary | ICD-10-CM

## 2019-03-07 DIAGNOSIS — E7800 Pure hypercholesterolemia, unspecified: Secondary | ICD-10-CM | POA: Diagnosis not present

## 2019-03-07 DIAGNOSIS — I2510 Atherosclerotic heart disease of native coronary artery without angina pectoris: Secondary | ICD-10-CM | POA: Diagnosis not present

## 2019-03-07 DIAGNOSIS — I255 Ischemic cardiomyopathy: Secondary | ICD-10-CM | POA: Diagnosis not present

## 2019-03-07 NOTE — Patient Instructions (Signed)
Medication Instructions:  NO CHANGE *If you need a refill on your cardiac medications before your next appointment, please call your pharmacy*  Lab Work: Your physician recommends that you return for lab work PRIOR TO EATING  If you have labs (blood work) drawn today and your tests are completely normal, you will receive your results only by: Marland Kitchen MyChart Message (if you have MyChart) OR . A paper copy in the mail If you have any lab test that is abnormal or we need to change your treatment, we will call you to review the results.  Testing/Procedures: Your physician has requested that you have an echocardiogram. Echocardiography is a painless test that uses sound waves to create images of your heart. It provides your doctor with information about the size and shape of your heart and how well your heart's chambers and valves are working. This procedure takes approximately one hour. There are no restrictions for this procedure.[place]    Follow-Up: At [facility], you and your health needs are our priority.  As part of our continuing mission to provide you with exceptional heart care, we have created designated Provider Care Teams.  These Care Teams include your primary Cardiologist (physician) and Advanced Practice Providers (APPs -  Physician Assistants and Nurse Practitioners) who all work together to provide you with the care you need, when you need it.  Your next appointment:   12 month(s)  The format for your next appointment:   Either In Person or Virtual  Provider:   You may see Kirk Ruths, MD or one of the following Advanced Practice Providers on your designated Care Team:    Kerin Ransom, PA-C  [place], [place]  Coletta Memos, [place]

## 2019-03-16 ENCOUNTER — Other Ambulatory Visit: Payer: BC Managed Care – PPO | Admitting: *Deleted

## 2019-03-16 ENCOUNTER — Ambulatory Visit (HOSPITAL_COMMUNITY): Payer: BC Managed Care – PPO | Attending: Cardiology

## 2019-03-16 ENCOUNTER — Other Ambulatory Visit: Payer: Self-pay

## 2019-03-16 DIAGNOSIS — I255 Ischemic cardiomyopathy: Secondary | ICD-10-CM

## 2019-03-16 LAB — COMPREHENSIVE METABOLIC PANEL
ALT: 61 IU/L — ABNORMAL HIGH (ref 0–44)
AST: 33 IU/L (ref 0–40)
Albumin/Globulin Ratio: 2 (ref 1.2–2.2)
Albumin: 4.3 g/dL (ref 3.8–4.8)
Alkaline Phosphatase: 71 IU/L (ref 39–117)
BUN/Creatinine Ratio: 15 (ref 10–24)
BUN: 17 mg/dL (ref 8–27)
Bilirubin Total: 0.7 mg/dL (ref 0.0–1.2)
CO2: 23 mmol/L (ref 20–29)
Calcium: 9.6 mg/dL (ref 8.6–10.2)
Chloride: 102 mmol/L (ref 96–106)
Creatinine, Ser: 1.17 mg/dL (ref 0.76–1.27)
GFR calc Af Amer: 77 mL/min/{1.73_m2} (ref >59)
GFR calc non Af Amer: 67 mL/min/{1.73_m2} (ref >59)
Globulin, Total: 2.2 g/dL (ref 1.5–4.5)
Glucose: 101 mg/dL — ABNORMAL HIGH (ref 65–99)
Potassium: 4.7 mmol/L (ref 3.5–5.2)
Sodium: 139 mmol/L (ref 134–144)
Total Protein: 6.5 g/dL (ref 6.0–8.5)

## 2019-03-16 LAB — LIPID PANEL
Chol/HDL Ratio: 3.5 ratio (ref 0.0–5.0)
Cholesterol, Total: 130 mg/dL (ref 100–199)
HDL: 37 mg/dL — ABNORMAL LOW (ref >39)
LDL Chol Calc (NIH): 41 mg/dL (ref 0–99)
Triglycerides: 351 mg/dL — ABNORMAL HIGH (ref 0–149)
VLDL Cholesterol Cal: 52 mg/dL — ABNORMAL HIGH (ref 5–40)

## 2019-03-17 ENCOUNTER — Encounter: Payer: Self-pay | Admitting: *Deleted

## 2019-03-23 ENCOUNTER — Other Ambulatory Visit: Payer: Self-pay | Admitting: Cardiology

## 2019-03-23 NOTE — Telephone Encounter (Signed)
Rx has been sent to the pharmacy electronically. ° °

## 2019-04-02 ENCOUNTER — Other Ambulatory Visit: Payer: Self-pay | Admitting: Cardiology

## 2019-05-24 ENCOUNTER — Other Ambulatory Visit: Payer: Self-pay | Admitting: Cardiology

## 2019-07-02 ENCOUNTER — Other Ambulatory Visit: Payer: Self-pay | Admitting: Cardiology

## 2020-02-12 ENCOUNTER — Other Ambulatory Visit: Payer: Self-pay | Admitting: Cardiology

## 2020-03-08 ENCOUNTER — Other Ambulatory Visit: Payer: Self-pay | Admitting: Cardiology

## 2020-03-16 ENCOUNTER — Other Ambulatory Visit: Payer: Self-pay | Admitting: Cardiology

## 2020-03-16 NOTE — Progress Notes (Signed)
Virtual Visit via Telephone Note   This visit type was conducted due to national recommendations for restrictions regarding the COVID-19 Pandemic (e.g. social distancing) in an effort to limit this patient's exposure and mitigate transmission in our community.  Due to his co-morbid illnesses, this patient is at least at moderate risk for complications without adequate follow up.  This format is felt to be most appropriate for this patient at this time.  The patient did not have access to video technology/had technical difficulties with video requiring transitioning to audio format only (telephone).  All issues noted in this document were discussed and addressed.  No physical exam could be performed with this format.  Please refer to the patient's chart for his  consent to telehealth for [facility]. Virtual platform was offered given ongoing worsening Covid-19 pandemic.  Date:  03/19/2020   ID:  Ricky, Harrison 19-Jul-1955, MRN [id number]  Patient Location: Home Provider Location: [place] Office  PCP:  [name], DO  Cardiologist:  Kirk Ruths, MD  Electrophysiologist:  None   Evaluation Performed:  Follow-Up Visit  Chief Complaint: follow-up of CAD  History of Present Illness:    LELYN Harrison is a 65 y.o. male with a history of CAD s/p DES to LAD in 08/2016, ischemic cardiomyopathy, hypertension, hyperlipidemia, and obstructive sleep apnea not on CPAP who is followed by Dr. Stanford Breed and presents today for routine follow-up.  Patient was referred to Dr. Stanford Breed in 08/2016 for further evaluation of chest pain that was concerning for unstable angina. He was admitted at that time and underwent left cardiac catheterization which showed CTO of proximal LAD. LVEF was 45-50% by visual estimate. He underwent successful PCI with DES to LAD lesion. Patient has done well since then. He was last seen by Dr. Stanford Breed in 02/2019 at which time he was still doing well with no  cardiac complaints. Echo was ordered to reassess EF. Echo in 03/2019 showed LVEF of 60-65% with normal wall motion and no valvular disease.   Patient presents today for routine follow-up via telephone visit. Patient is doing very well from a cardiac standpoint. He is staying activity. He runs 3 miles and does stair climber for about 10 minutes twice a week. No chest pain or shortness of breath with this. He does note some difficulty breathing at night as well as some snoring when laying flat which he attributes to a known deviated septum. He does have a history of sleep apnea but did not like using CPAP machine.   Does not sound like true orthopnea. No PND or edema. No palpitations, lightheadedness, dizziness, or syncope.   Past Medical History:  Diagnosis Date  . Basal cell carcinoma of face 2006  . History of kidney stones   . Hypertension   . Inguinal hernia, right    Dr. Dalbert Batman; "still flares up sometimes" (08/19/2016)  . Pneumonia ~ 1979   "walking"  . Sleep apnea    "had mask; couldn't tolerate it; I usually use nasal strips now" (08/19/2016)   Past Surgical History:  Procedure Laterality Date  . CORONARY STENT INTERVENTION N/A 08/20/2016   Procedure: Coronary Stent Intervention;  Surgeon: Jettie Booze, MD;  Location: [place] [facility];  Service: Cardiovascular;  Laterality: N/A;  . CYSTOSCOPY W/ STONE MANIPULATION  X 2  . INGUINAL HERNIA REPAIR  01/01/2012   Procedure: LAPAROSCOPIC INGUINAL HERNIA;  Surgeon: Adin Hector, MD;  Location: [facility];  Service: General;  Laterality: Left;  .  INGUINAL HERNIA REPAIR Right 2016   Dr. Dalbert Batman  . INGUINAL HERNIA REPAIR Left   . INSERTION OF MESH  01/01/2012   Procedure: INSERTION OF MESH;  Surgeon: Adin Hector, MD;  Location: [facility];  Service: General;  Laterality: Left;  . LEFT HEART CATH AND CORONARY ANGIOGRAPHY N/A 08/20/2016   Procedure: Left Heart Cath and Coronary Angiography;  Surgeon: Jettie Booze, MD;  Location:  [place] [facility];  Service: Cardiovascular;  Laterality: N/A;  . LITHOTRIPSY  2008   5 times total - Dr. Risa Grill  . LITHOTRIPSY  X ~ 5  . MOHS SURGERY  2006   basal cell; face  . NASAL SEPTUM SURGERY  2004   Dr. Wilburn Cornelia     Current Meds  Medication Sig  . aspirin 81 MG chewable tablet Chew 1 tablet (81 mg total) by mouth daily.  Marland Kitchen atorvastatin (LIPITOR) 80 MG tablet TAKE 1 TABLET (80 MG TOTAL) BY MOUTH DAILY AT 6 PM.  . hydrochlorothiazide (MICROZIDE) 12.5 MG capsule TAKE 1 CAPSULE BY MOUTH EVERY DAY  . losartan (COZAAR) 50 MG tablet TAKE 1 TABLET BY MOUTH EVERY DAY  . metoprolol tartrate (LOPRESSOR) 25 MG tablet TAKE ONE-HALF TABLET (12.5 MG TOTAL) BY MOUTH 2 (TWO) TIMES DAILY.  . Multiple Vitamin (MULTIVITAMIN WITH MINERALS) TABS Take 1 tablet by mouth daily. Takes on Mondays.  . nitroGLYCERIN (NITROSTAT) 0.4 MG SL tablet Place 1 tablet (0.4 mg total) under the tongue every 5 (five) minutes as needed for chest pain.     Allergies:   Sulfonamide derivatives   Social History   Tobacco Use  . Smoking status: Never Smoker  . Smokeless tobacco: Never Used  Vaping Use  . Vaping Use: Never used  Substance Use Topics  . Alcohol use: Yes    Alcohol/week: 5.0 standard drinks    Types: 5 Shots of liquor per week  . Drug use: No     Family Hx: The patient's family history includes Cancer in his mother; Heart attack (age of onset: 73) in his father.  ROS:   Please see the history of present illness.    All other systems reviewed and are negative.   Prior CV studies:    The following studies were reviewed today:   Left Cardiac Catheterization 08/20/2016:  There is mild left ventricular systolic dysfunction.  The left ventricular ejection fraction is 45-50% by visual estimate.  LV end diastolic pressure is normal.  There is no aortic valve stenosis.  Prox LAD lesion, 100 %stenosed. Chronic total occlusion.  A STENT SYNERGY DES 2.5X38 drug eluting stent was  successfully placed.  Post intervention, there is a 0% residual stenosis.   Continue dual antiplatelet therapy for at least a year. Continue aggressive secondary prevention. Patient was loaded with Brilinta in the Cath Lab.  Will continue Angiomax for an hour postprocedure. _______________  Echocardiogram 03/16/2019: Impressions: 1. Left ventricular ejection fraction, by visual estimation, is 60 to  65%. The left ventricle has normal function. There is no left ventricular  hypertrophy.  2. The left ventricle has no regional wall motion abnormalities.  3. Global right ventricle has normal systolic function.The right  ventricular size is normal. No increase in right ventricular wall  thickness.  4. Left atrial size was normal.  5. Right atrial size was normal.  6. The mitral valve is normal in structure. No evidence of mitral valve  regurgitation. No evidence of mitral stenosis.  7. The tricuspid valve is normal in structure.  8. The tricuspid valve is normal in structure. Tricuspid valve  regurgitation is not demonstrated.  9. The aortic valve is normal in structure. Aortic valve regurgitation is  not visualized. No evidence of aortic valve sclerosis or stenosis.  10. The pulmonic valve was normal in structure. Pulmonic valve  regurgitation is not visualized.  11. Normal pulmonary artery systolic pressure.  12. The inferior vena cava is normal in size with greater than 50%  respiratory variability, suggesting right atrial pressure of 3 mmHg.    Labs/Other Tests and Data Reviewed:    EKG:  Most recent EKG from 03/07/2019 personally reviewed and demonstrates:  Normal sinus rhythm, rate 62, with some non-specific T wave flattening in aVL but no acute ST/T changes. Normal PR and QRS intervals. QTc 414 ms.  Recent Labs: No results found for requested labs within last 8760 hours.   Recent Lipid Panel Lab Results  Component Value Date/Time   CHOL 130 03/16/2019 08:31 AM    TRIG 351 (H) 03/16/2019 08:31 AM   HDL 37 (L) 03/16/2019 08:31 AM   CHOLHDL 3.5 03/16/2019 08:31 AM   CHOLHDL 4.7 08/20/2016 06:00 AM   LDLCALC 41 03/16/2019 08:31 AM   LDLDIRECT 80.0 07/15/2016 09:15 AM    Wt Readings from Last 3 Encounters:  03/19/20 163 lb (73.9 kg)  03/07/19 175 lb 3.2 oz (79.5 kg)  02/25/18 178 lb (80.7 kg)     Objective:    Vital Signs:  BP 115/71   Ht 5' 7.5" (1.715 m)   Wt 163 lb (73.9 kg)   BMI 25.15 kg/m    Vital Signs Reviewed. He forgot to look at his heart rate this morning but thinks it was in the 70's. General: No acute distress. Pulm: No labored breathing. No coughing during visit. No audible wheezing. Speaking in full sentences. Neuro: Alert and oriented. No slurred speech. Answers questions appropriately. Psych: Pleasant affect.  ASSESSMENT & PLAN:    CAD - S/p DES to proximal LAD in 08/2016. - No angina. - Continue aspirin, beta-blocker, and high-intensity statin.  Ischemic Cardiomyopathy - LVEF 45-50% on LHC in 08/2016 but improved to 60-65% on Echo in 03/2019.  - Stable. No acute CHF symptoms. - Continue Losartan 50mg  daily and Lopressor 12.5mg  twice daily. - Will check CMET.  Hypertension - BP well controlled. - Continue current medications: Losartan 50mg  daily, Lopressor 12.5mg  twice daily, and HCTZ 12.5mg  daily.   Hyperlipidemia - Lipid panel in 03/2019: Total Cholesterol 130, Triglycerides 351, HDL 37, LDL 41.  - LDL goal <70 given CAD. - Continue Lipitor 80mg  daily.  - Will have patient come in for fasting lipid panel and CMET. If Triglycerdies still significantly elevated, may need to consider adding Vascepa or Lovaza.   Obstructive Sleep Apnea - Patient has history of sleep apnea but did not like CPAP machine.  - He does note some snoring and trouble breathing at night. He attributes this to his deviated septum. He is planning on following up with Dr. Wilburn Cornelia (ENT) soon. Offered repeat sleep study but he would like to see  Dr. Wilburn Cornelia first.  Time:   Today, I have spent 12 minutes with the patient with telehealth technology discussing the above problems.     Medication Adjustments/Labs and Tests Ordered: Current medicines are reviewed at length with the patient today.  Concerns regarding medicines are outlined above.   Follow Up:  In Person in 1 year(s) with Dr. Stanford Breed  Signed, Darreld Mclean, PA-C  03/19/2020 11:31 AM  [facility]

## 2020-03-19 ENCOUNTER — Encounter: Payer: Self-pay | Admitting: Student

## 2020-03-19 ENCOUNTER — Telehealth (INDEPENDENT_AMBULATORY_CARE_PROVIDER_SITE_OTHER): Payer: Self-pay | Admitting: Student

## 2020-03-19 VITALS — BP 115/71 | Ht 67.5 in | Wt 163.0 lb

## 2020-03-19 DIAGNOSIS — E785 Hyperlipidemia, unspecified: Secondary | ICD-10-CM

## 2020-03-19 DIAGNOSIS — I255 Ischemic cardiomyopathy: Secondary | ICD-10-CM

## 2020-03-19 DIAGNOSIS — I10 Essential (primary) hypertension: Secondary | ICD-10-CM

## 2020-03-19 DIAGNOSIS — G4733 Obstructive sleep apnea (adult) (pediatric): Secondary | ICD-10-CM

## 2020-03-19 DIAGNOSIS — I2510 Atherosclerotic heart disease of native coronary artery without angina pectoris: Secondary | ICD-10-CM

## 2020-03-19 NOTE — Patient Instructions (Signed)
Medication Instructions:  No Changes *If you need a refill on your cardiac medications before your next appointment, please call your pharmacy*   Lab Work: CMP, Lipid Panel If you have labs (blood work) drawn today and your tests are completely normal, you will receive your results only by: Marland Kitchen MyChart Message (if you have MyChart) OR . A paper copy in the mail If you have any lab test that is abnormal or we need to change your treatment, we will call you to review the results.   Testing/Procedures: No Testing    Follow-Up: At [facility], you and your health needs are our priority.  As part of our continuing mission to provide you with exceptional heart care, we have created designated Provider Care Teams.  These Care Teams include your primary Cardiologist (physician) and Advanced Practice Providers (APPs -  Physician Assistants and Nurse Practitioners) who all work together to provide you with the care you need, when you need it.    Your next appointment:   1 year(s)  The format for your next appointment:   In Person  Provider:   Kirk Ruths, MD

## 2020-03-27 ENCOUNTER — Telehealth: Payer: Self-pay | Admitting: *Deleted

## 2020-03-27 NOTE — Telephone Encounter (Signed)
Left message on machine to call back  Trying to see if we can schedule him for cpe.  Last one done was 2018.

## 2020-03-30 ENCOUNTER — Other Ambulatory Visit: Payer: Self-pay | Admitting: Cardiology

## 2020-04-24 ENCOUNTER — Other Ambulatory Visit: Payer: Self-pay | Admitting: Cardiology

## 2020-06-14 ENCOUNTER — Other Ambulatory Visit: Payer: Self-pay | Admitting: Cardiology

## 2020-10-08 LAB — COMPREHENSIVE METABOLIC PANEL
ALT: 43 IU/L (ref 0–44)
AST: 24 IU/L (ref 0–40)
Albumin/Globulin Ratio: 2.2 (ref 1.2–2.2)
Albumin: 4.3 g/dL (ref 3.8–4.8)
Alkaline Phosphatase: 76 IU/L (ref 44–121)
BUN/Creatinine Ratio: 15 (ref 10–24)
BUN: 16 mg/dL (ref 8–27)
Bilirubin Total: 0.5 mg/dL (ref 0.0–1.2)
CO2: 24 mmol/L (ref 20–29)
Calcium: 9.5 mg/dL (ref 8.6–10.2)
Chloride: 104 mmol/L (ref 96–106)
Creatinine, Ser: 1.08 mg/dL (ref 0.76–1.27)
Globulin, Total: 2 g/dL (ref 1.5–4.5)
Glucose: 102 mg/dL — ABNORMAL HIGH (ref 65–99)
Potassium: 4.7 mmol/L (ref 3.5–5.2)
Sodium: 141 mmol/L (ref 134–144)
Total Protein: 6.3 g/dL (ref 6.0–8.5)
eGFR: 77 mL/min/{1.73_m2} (ref >59)

## 2020-10-08 LAB — LIPID PANEL
Chol/HDL Ratio: 3 ratio (ref 0.0–5.0)
Cholesterol, Total: 110 mg/dL (ref 100–199)
HDL: 37 mg/dL — ABNORMAL LOW (ref >39)
LDL Chol Calc (NIH): 35 mg/dL (ref 0–99)
Triglycerides: 248 mg/dL — ABNORMAL HIGH (ref 0–149)
VLDL Cholesterol Cal: 38 mg/dL (ref 5–40)

## 2020-10-15 ENCOUNTER — Other Ambulatory Visit: Payer: Self-pay

## 2020-10-15 ENCOUNTER — Telehealth: Payer: Self-pay | Admitting: Student

## 2020-10-15 MED ORDER — ICOSAPENT ETHYL 1 G PO CAPS: 2.0000 g | ORAL_CAPSULE | Freq: Two times a day (BID) | ORAL | 11 refills | Status: DC

## 2020-10-15 NOTE — Telephone Encounter (Signed)
Patient returning call to discuss lab results  ?

## 2020-10-18 DIAGNOSIS — E781 Pure hyperglyceridemia: Secondary | ICD-10-CM | POA: Insufficient documentation

## 2020-11-07 ENCOUNTER — Other Ambulatory Visit: Payer: Self-pay | Admitting: Cardiology

## 2021-01-28 ENCOUNTER — Other Ambulatory Visit: Payer: Self-pay | Admitting: Cardiology

## 2021-02-03 ENCOUNTER — Other Ambulatory Visit: Payer: Self-pay | Admitting: Cardiology

## 2021-04-25 ENCOUNTER — Other Ambulatory Visit: Payer: Self-pay | Admitting: Cardiology

## 2021-06-02 ENCOUNTER — Encounter: Payer: Self-pay | Admitting: Family Medicine

## 2021-06-02 ENCOUNTER — Ambulatory Visit (INDEPENDENT_AMBULATORY_CARE_PROVIDER_SITE_OTHER): Payer: BC Managed Care – PPO | Admitting: Family Medicine

## 2021-06-02 ENCOUNTER — Other Ambulatory Visit: Payer: Self-pay | Admitting: Cardiology

## 2021-06-02 VITALS — BP 120/68 | HR 63 | Temp 98.5°F | Ht 67.5 in | Wt 159.4 lb

## 2021-06-02 DIAGNOSIS — Z0000 Encounter for general adult medical examination without abnormal findings: Secondary | ICD-10-CM

## 2021-06-02 LAB — LIPID PANEL
Cholesterol: 100 mg/dL (ref 0–200)
HDL: 37.2 mg/dL — ABNORMAL LOW (ref >39.00)
LDL Cholesterol: 29 mg/dL (ref 0–99)
NonHDL: 62.45
Total CHOL/HDL Ratio: 3
Triglycerides: 165 mg/dL — ABNORMAL HIGH (ref 0.0–149.0)
VLDL: 33 mg/dL (ref 0.0–40.0)

## 2021-06-02 LAB — COMPREHENSIVE METABOLIC PANEL
ALT: 34 U/L (ref 0–53)
AST: 24 U/L (ref 0–37)
Albumin: 4.4 g/dL (ref 3.5–5.2)
Alkaline Phosphatase: 57 U/L (ref 39–117)
BUN: 20 mg/dL (ref 6–23)
CO2: 28 mEq/L (ref 19–32)
Calcium: 9.3 mg/dL (ref 8.4–10.5)
Chloride: 105 mEq/L (ref 96–112)
Creatinine, Ser: 1.18 mg/dL (ref 0.40–1.50)
GFR: 65.44 mL/min (ref >60.00)
Glucose, Bld: 107 mg/dL — ABNORMAL HIGH (ref 70–99)
Potassium: 4.2 mEq/L (ref 3.5–5.1)
Sodium: 140 mEq/L (ref 135–145)
Total Bilirubin: 0.9 mg/dL (ref 0.2–1.2)
Total Protein: 6.5 g/dL (ref 6.0–8.3)

## 2021-06-02 LAB — CBC
HCT: 43.5 % (ref 39.0–52.0)
Hemoglobin: 14.9 g/dL (ref 13.0–17.0)
MCHC: 34.3 g/dL (ref 30.0–36.0)
MCV: 89.5 fl (ref 78.0–100.0)
Platelets: 211 10*3/uL (ref 150.0–400.0)
RBC: 4.86 Mil/uL (ref 4.22–5.81)
RDW: 12.3 % (ref 11.5–15.5)
WBC: 6.5 10*3/uL (ref 4.0–10.5)

## 2021-06-02 NOTE — Progress Notes (Signed)
Chief Complaint  ?Patient presents with  ? Annual Exam  ? ? ?Well Male ?Ricky Harrison is here for a complete physical.   ?His last physical was >1 year ago.  ?Current diet: in general, a "healthy" diet.  ?Current exercise: running, cycling ?Weight trend: steadily decreasing intentionally ?Fatigue out of ordinary? No. ?Seat belt? Yes.   ?Advanced directive? No ? ?Health maintenance ?Shingrix- No ?Colonoscopy- No ?Tetanus- Due ?HIV- Yes ?Hep C- Yes ?  ?Past Medical History:  ?Diagnosis Date  ? Basal cell carcinoma of face 2006  ? History of kidney stones   ? Hypertension   ? Inguinal hernia, right   ? Dr. Dalbert Batman; "still flares up sometimes" (08/19/2016)  ? Pneumonia ~ 1979  ? "walking"  ? Sleep apnea   ? "had mask; couldn't tolerate it; I usually use nasal strips now" (08/19/2016)  ?  ? ? ?Past Surgical History:  ?Procedure Laterality Date  ? CORONARY STENT INTERVENTION N/A 08/20/2016  ? Procedure: Coronary Stent Intervention;  Surgeon: Jettie Booze, MD;  Location: [facility];  Service: Cardiovascular;  Laterality: N/A;  ? CYSTOSCOPY W/ STONE MANIPULATION  X 2  ? INGUINAL HERNIA REPAIR  01/01/2012  ? Procedure: LAPAROSCOPIC INGUINAL HERNIA;  Surgeon: Adin Hector, MD;  Location: [facility];  Service: General;  Laterality: Left;  ? INGUINAL HERNIA REPAIR Right 2016  ? Dr. Dalbert Batman  ? [place] Left   ? INSERTION OF MESH  01/01/2012  ? Procedure: INSERTION OF MESH;  Surgeon: Adin Hector, MD;  Location: [facility];  Service: General;  Laterality: Left;  ? LEFT HEART CATH AND CORONARY ANGIOGRAPHY N/A 08/20/2016  ? Procedure: Left Heart Cath and Coronary Angiography;  Surgeon: Jettie Booze, MD;  Location: [facility];  Service: Cardiovascular;  Laterality: N/A;  ? LITHOTRIPSY  2008  ? 5 times total - Dr. Risa Grill  ? LITHOTRIPSY  X ~ 5  ? MOHS SURGERY  2006  ? basal cell; face  ? NASAL SEPTUM SURGERY  2004  ? Dr. Wilburn Cornelia  ? ? ?Medications  ?Current Outpatient Medications on File  Prior to Visit  ?Medication Sig Dispense Refill  ? aspirin 81 MG chewable tablet Chew 1 tablet (81 mg total) by mouth daily.    ? atorvastatin (LIPITOR) 80 MG tablet TAKE 1 TABLET BY MOUTH DAILY AT 6 PM. 90 tablet 3  ? hydrochlorothiazide (MICROZIDE) 12.5 MG capsule TAKE 1 CAPSULE BY MOUTH EVERY DAY 90 capsule 3  ? icosapent Ethyl (VASCEPA) 1 g capsule Take 2 capsules (2 g total) by mouth 2 (two) times daily. 120 capsule 11  ? losartan (COZAAR) 50 MG tablet TAKE 1 TABLET BY MOUTH EVERY DAY 90 tablet 3  ? metoprolol tartrate (LOPRESSOR) 25 MG tablet TAKE HALF TABLET (12.5 MG TOTAL) BY MOUTH 2 (TWO) TIMES DAILY.DUE FOR APPT 60 tablet 1  ? Multiple Vitamin (MULTIVITAMIN WITH MINERALS) TABS Take 1 tablet by mouth daily. Takes on Mondays.    ? nitroGLYCERIN (NITROSTAT) 0.4 MG SL tablet Place 1 tablet (0.4 mg total) under the tongue every 5 (five) minutes as needed for chest pain. 25 tablet 2  ? ?Allergies ?Allergies  ?Allergen Reactions  ? Sulfonamide Derivatives Rash  ?  At injection site. Made the entire vein that was injected red and spread up the arm.  ? ? ?Family History ?Family History  ?Problem Relation Age of Onset  ? Cancer Mother   ?     throat  ? Heart attack  Father 78  ? ? ?Review of Systems: ?Constitutional:  no fevers ?Eye:  no recent significant change in vision ?Ear/Nose/Mouth/Throat:  Ears:  no new hearing loss ?Nose/Mouth/Throat:  no complaints of nasal congestion, no sore throat ?Cardiovascular:  no chest pain ?Respiratory:  no shortness of breath ?Gastrointestinal:  no change in bowel habits ?GU:  Male: negative for dysuria, frequency ?Musculoskeletal/Extremities:  no joint pain ?Integumentary (Skin/Breast):  no abnormal skin lesions reported ?Neurologic:  no headaches ?Endocrine: No unexpected weight changes ?Hematologic/Lymphatic:  no abnormal bleeding ? ?Exam ?BP 120/68   Pulse 63   Temp 98.5 ?F (36.9 ?C) (Oral)   Ht 5' 7.5" (1.715 m)   Wt 159 lb 6 oz (72.3 kg)   SpO2 96%   BMI 24.59 kg/m?   ?General:  well developed, well nourished, in no apparent distress ?Skin:  no significant moles, warts, or growths ?Head:  no masses, lesions, or tenderness ?Eyes:  pupils equal and round, sclera anicteric without injection ?Ears:  canals without lesions, TMs shiny without retraction, no obvious effusion, no erythema ?Nose:  nares patent, septum midline, mucosa normal ?Throat/Pharynx:  lips and gingiva without lesion; tongue and uvula midline; non-inflamed pharynx; no exudates or postnasal drainage ?Neck: neck supple without adenopathy, thyromegaly, or masses ?Cardiac: RRR, no bruits, no LE edema ?Lungs:  clear to auscultation, breath sounds equal bilaterally, no respiratory distress ?Abdomen: BS+, soft, non-tender, non-distended, no masses or organomegaly noted ?Rectal: Deferred ?Musculoskeletal:  symmetrical muscle groups noted without atrophy or deformity ?Neuro:  gait normal; deep tendon reflexes normal and symmetric ?Psych: well oriented with normal range of affect and appropriate judgment/insight ? ?Assessment and Plan ? ?Well adult exam - Plan: CBC, Comprehensive metabolic panel, Lipid panel  ? ?Well 66 y.o. male. ?Counseled on diet and exercise. ?Counseled on risks and benefits of prostate cancer screening with PSA. The patient agrees to forego testing. ?Shingrix rec'd. ?Rec'd CCS, politely declined for now. He will let me know if he changes his mind. We discussed colonoscopy in addition to [place].  ?Advanced directive form provided today.  ?Immunizations, labs, and further orders as above. ?Follow up in 1 year. ?The patient voiced understanding and agreement to the plan. ? ?[name], DO ?06/02/21 ?7:57 AM ? ?

## 2021-06-02 NOTE — Patient Instructions (Addendum)
Give [place] 2-3 business days to get the results of your labs back.  ? ?Keep the diet clean and stay active. ? ?Let me know if you change your mind about the colonoscopy.  ? ?The Shingrix vaccine (for shingles) is a 2 shot series spaced 2-6 months apart. It can make people feel low energy, achy and almost like they have the flu for 48 hours after injection. 1/5 people can have nausea and/or vomiting. Please plan accordingly when deciding on when to get this shot. Call our office for a nurse visit appointment to get this. The second shot of the series is less severe regarding the side effects, but it still lasts 48 hours.  ? ?Please get me a copy of your advanced directive form at your convenience.  ? ?Let us know if you need anything. ?

## 2021-07-21 ENCOUNTER — Other Ambulatory Visit: Payer: Self-pay | Admitting: Cardiology

## 2021-10-28 ENCOUNTER — Other Ambulatory Visit: Payer: Self-pay | Admitting: Student

## 2021-11-05 ENCOUNTER — Other Ambulatory Visit: Payer: Self-pay | Admitting: Cardiology

## 2021-11-30 ENCOUNTER — Other Ambulatory Visit: Payer: Self-pay | Admitting: Cardiology

## 2021-12-12 ENCOUNTER — Other Ambulatory Visit: Payer: Self-pay | Admitting: Cardiology

## 2021-12-24 ENCOUNTER — Other Ambulatory Visit: Payer: Self-pay | Admitting: Cardiology

## 2022-01-16 ENCOUNTER — Other Ambulatory Visit: Payer: Self-pay | Admitting: Cardiology

## 2022-01-17 ENCOUNTER — Other Ambulatory Visit: Payer: Self-pay | Admitting: Cardiology

## 2022-01-21 NOTE — Progress Notes (Unsigned)
Cardiology Clinic Note   Patient Name: Ricky Harrison Date of Encounter: 01/22/2022  Primary Care Provider:  [name], DO Primary Cardiologist:  Kirk Ruths, MD  Patient Profile    Ricky Harrison 66 year old male presents the clinic today for follow-up evaluation of his coronary artery disease and ischemic cardiomyopathy.  Past Medical History    Past Medical History:  Diagnosis Date   Basal cell carcinoma of face 2006   History of kidney stones    Hypertension    Inguinal hernia, right    Dr. Dalbert Batman; "still flares up sometimes" (08/19/2016)   Pneumonia ~ 1979   "walking"   Sleep apnea    "had mask; couldn't tolerate it; I usually use nasal strips now" (08/19/2016)   Past Surgical History:  Procedure Laterality Date   CORONARY STENT INTERVENTION N/A 08/20/2016   Procedure: Coronary Stent Intervention;  Surgeon: Jettie Booze, MD;  Location: [facility];  Service: Cardiovascular;  Laterality: N/A;   CYSTOSCOPY W/ STONE MANIPULATION  X 2   INGUINAL HERNIA REPAIR  01/01/2012   Procedure: LAPAROSCOPIC INGUINAL HERNIA;  Surgeon: Adin Hector, MD;  Location: [facility];  Service: General;  Laterality: Left;   INGUINAL HERNIA REPAIR Right 2016   Dr. Tora Kindred HERNIA REPAIR Left    INSERTION OF MESH  01/01/2012   Procedure: INSERTION OF MESH;  Surgeon: Adin Hector, MD;  Location: [facility];  Service: General;  Laterality: Left;   LEFT HEART CATH AND CORONARY ANGIOGRAPHY N/A 08/20/2016   Procedure: Left Heart Cath and Coronary Angiography;  Surgeon: Jettie Booze, MD;  Location: [facility];  Service: Cardiovascular;  Laterality: N/A;   LITHOTRIPSY  2008   5 times total - Dr. Risa Grill   LITHOTRIPSY  X ~ [place]  2006   basal cell; face   NASAL SEPTUM SURGERY  2004   Dr. Wilburn Cornelia    Allergies  Allergies  Allergen Reactions   Sulfonamide Derivatives Rash    At injection site. Made the entire vein that was  injected red and spread up the arm.    History of Present Illness    Ricky Harrison has a PMH of ischemic cardiomyopathy, HTN, HLD, OSA not on CPAP, and coronary artery disease status post PCI with DES to his LAD 7/18.  His LVEF at that time was noted to be 45-50%.  His echocardiogram 2/21 showed an LVEF of 60-65% with normal wall motion and no valvular abnormalities.  He was seen in follow-up by Dr. Stanford Breed on 7/18.  He noted chest pain that was concerning for unstable angina.  At that time he was admitted and underwent cardiac catheterization.  He was noted to have a CTO of his proximal LAD.  He underwent PCI with DES x 1 to his LAD lesion.  He followed up with Dr. Stanford Breed 1/21 and was stable from a cardiac standpoint.  He followed up with Sande Rives, PA-C on 03/19/2020.  They connected via telephone visit.  He continued to do well from a cardiac standpoint.  He was staying active.  He was running 3 miles and doing stair climber for about 10 minutes 2 times per week.  He denied chest pain and shortness of breath with his activity.  He presents to the clinic today for follow-up evaluation states he feels well.  He just completed a 5K.  He reports the course was hilly.  He denies episodes of  chest pain with his running.  When he is not racing he continues to run 2 to 3 miles per day.  He has been eating a heart healthy diet.  He reports that it is concerning that several of his friends from high school have started to pass away.  His blood pressure today is 114/66.  His LDL cholesterol was 29 06/02/2021.  I will give him the salty 6 diet sheet, have him maintain his physical activity, and plan follow-up in 12 months.  Today he denies chest pain, shortness of breath, lower extremity edema, fatigue, palpitations, melena, hematuria, hemoptysis, diaphoresis, weakness, presyncope, syncope, orthopnea, and PND.   Home Medications    Prior to Admission medications   Medication Sig Start Date End Date  Taking? Authorizing Provider  aspirin 81 MG chewable tablet Chew 1 tablet (81 mg total) by mouth daily. 08/22/16   Lyda Jester M, PA-C  atorvastatin (LIPITOR) 80 MG tablet TAKE 1 TABLET BY MOUTH DAILY AT 6 PM. 06/02/21   Lelon Perla, MD  hydrochlorothiazide (MICROZIDE) 12.5 MG capsule TAKE 1 CAPSULE BY MOUTH EVERY DAY 02/04/21   Lelon Perla, MD  losartan (COZAAR) 50 MG tablet TAKE 1 TABLET (50 MG TOTAL) BY MOUTH DAILY. SCHEDULE OFFICE VISIT FOR FUTURE REFILLS LAST ATTEMPT 12/24/21   Lelon Perla, MD  metoprolol tartrate (LOPRESSOR) 25 MG tablet Take half tablet by mouth tow times a day. APPOINTMENT NEEDED FOR FUTURE REFILLS.-!st attempt 01/20/22   Lelon Perla, MD  Multiple Vitamin (MULTIVITAMIN WITH MINERALS) TABS Take 1 tablet by mouth daily. Takes on Mondays.    [provider]  nitroGLYCERIN (NITROSTAT) 0.4 MG SL tablet Place 1 tablet (0.4 mg total) under the tongue every 5 (five) minutes as needed for chest pain. 08/21/16   Simmons, Brittainy M, PA-C  VASCEPA 1 g capsule TAKE 2 CAPSULES BY MOUTH 2 TIMES DAILY. 10/28/21   Lelon Perla, MD    Family History    Family History  Problem Relation Age of Onset   Cancer Mother        throat   Heart attack Father 80   He indicated that his mother is alive. He indicated that his father is deceased.  Social History    Social History   Socioeconomic History   Marital status: Married    Spouse name: Not on file   Number of children: 2   Years of education: Not on file   Highest education level: Not on file  Occupational History   Not on file  Tobacco Use   Smoking status: Never   Smokeless tobacco: Never  Vaping Use   Vaping Use: Never used  Substance and Sexual Activity   Alcohol use: Yes    Alcohol/week: 5.0 standard drinks of alcohol    Types: 5 Shots of liquor per week   Drug use: No   Sexual activity: Yes  Other Topics Concern   Not on file  Social History Narrative   Not on file    Social Determinants of Health   Financial Resource Strain: Not on file  Food Insecurity: Not on file  Transportation Needs: Not on file  Physical Activity: Not on file  Stress: Not on file  Social Connections: Not on file  Intimate Partner Violence: Not on file     Review of Systems    General:  No chills, fever, night sweats or weight changes.  Cardiovascular:  No chest pain, dyspnea on exertion, edema, orthopnea, palpitations, paroxysmal nocturnal dyspnea.  Dermatological: No rash, lesions/masses Respiratory: No cough, dyspnea Urologic: No hematuria, dysuria Abdominal:   No nausea, vomiting, diarrhea, bright red blood per rectum, melena, or hematemesis Neurologic:  No visual changes, wkns, changes in mental status. All other systems reviewed and are otherwise negative except as noted above.  Physical Exam    VS:  BP 114/66 (BP Location: Right Arm, Patient Position: Sitting, Cuff Size: Normal)   Pulse 84   Ht 5' 7.5" (1.715 m)   Wt 161 lb (73 kg)   BMI 24.84 kg/m  , BMI Body mass index is 24.84 kg/m. GEN: Well nourished, well developed, in no acute distress. HEENT: normal. Neck: Supple, no JVD, carotid bruits, or masses. Cardiac: RRR, no murmurs, rubs, or gallops. No clubbing, cyanosis, edema.  Radials/DP/PT 2+ and equal bilaterally.  Respiratory:  Respirations regular and unlabored, clear to auscultation bilaterally. GI: Soft, nontender, nondistended, BS + x 4. MS: no deformity or atrophy. Skin: warm and dry, no rash. Neuro:  Strength and sensation are intact. Psych: Normal affect.  Accessory Clinical Findings    Recent Labs: 06/02/2021: ALT 34; BUN 20; Creatinine, Ser 1.18; Hemoglobin 14.9; Platelets 211.0; Potassium 4.2; Sodium 140   Recent Lipid Panel    Component Value Date/Time   CHOL 100 06/02/2021 0808   CHOL 110 10/07/2020 0826   TRIG 165.0 (H) 06/02/2021 0808   HDL 37.20 (L) 06/02/2021 0808   HDL 37 (L) 10/07/2020 0826   CHOLHDL 3 06/02/2021 0808    VLDL 33.0 06/02/2021 0808   LDLCALC 29 06/02/2021 0808   LDLCALC 35 10/07/2020 0826   LDLDIRECT 80.0 07/15/2016 0915         ECG personally reviewed by me today-sinus bradycardia no ectopy 52 bpm- No acute changes  EKG 03/07/2019 Normal sinus rhythm nonspecific T wave flattening in aVL and no acute changes.  Rate 62 bpm  Left Cardiac Catheterization 08/20/2016:  There is mild left ventricular systolic dysfunction. The left ventricular ejection fraction is 45-50% by visual estimate. LV end diastolic pressure is normal. There is no aortic valve stenosis. Prox LAD lesion, 100 %stenosed. Chronic total occlusion. A STENT SYNERGY DES 2.5X38 drug eluting stent was successfully placed. Post intervention, there is a 0% residual stenosis.   Continue dual antiplatelet therapy for at least a year. Continue aggressive secondary prevention. Patient was loaded with Brilinta in the Cath Lab.   Will continue Angiomax for an hour postprocedure. _______________   Echocardiogram 03/16/2019: Impressions: 1. Left ventricular ejection fraction, by visual estimation, is 60 to  65%. The left ventricle has normal function. There is no left ventricular  hypertrophy.   2. The left ventricle has no regional wall motion abnormalities.   3. Global right ventricle has normal systolic function.The right  ventricular size is normal. No increase in right ventricular wall  thickness.   4. Left atrial size was normal.   5. Right atrial size was normal.   6. The mitral valve is normal in structure. No evidence of mitral valve  regurgitation. No evidence of mitral stenosis.   7. The tricuspid valve is normal in structure.   8. The tricuspid valve is normal in structure. Tricuspid valve  regurgitation is not demonstrated.   9. The aortic valve is normal in structure. Aortic valve regurgitation is  not visualized. No evidence of aortic valve sclerosis or stenosis.  10. The pulmonic valve was normal in structure.  Pulmonic valve  regurgitation is not visualized.  11. Normal pulmonary artery systolic pressure.  12. The inferior  vena cava is normal in size with greater than 50%  respiratory variability, suggesting right atrial pressure of 3 mmHg.   Assessment & Plan   1.  Ischemic cardiomyopathy-no increased DOE or activity intolerance.  Echocardiogram 2/21 showed improved EF of 60 to 65%.  PCP labs unremarkable. Continue losartan, metoprolol Heart healthy low-sodium diet-salty 6 given Increase physical activity as tolerated Daily weights-contact office with a weight increase of 2 to 3 pounds overnight or 5 pounds in 1 week.   Coronary artery disease-denies recent episodes of arm neck back or chest discomfort.  Underwent cardiac catheterization and PCI with DES x 1 to his proximal LAD 7/18 Continue current medical therapy  Essential hypertension-BP today 114/66 Continue losartan, metoprolol Heart healthy low-sodium diet-salty 6 given Increase physical activity as tolerated  Hyperlipidemia-LDL 29 06/02/21 Continue atorvastatin Heart healthy low-sodium high-fiber diet Increase physical activity as tolerated Repeat fasting lipids and LFTs  OSA-previously did not tolerate CPAP machine. Follows with ENT  Disposition: Follow-up with Dr. Stanford Breed in 1 year.   Jossie Ng. Gorden Stthomas NP-C     01/22/2022, 1:50 PM [facility] [address] Office [phone] Fax [phone]    I spent 14 minutes examining this patient, reviewing medications, and using patient centered shared decision making involving her cardiac care.  Prior to her visit I spent greater than 20 minutes reviewing her past medical history,  medications, and prior cardiac tests.

## 2022-01-22 ENCOUNTER — Encounter: Payer: Self-pay | Admitting: General Practice

## 2022-01-22 ENCOUNTER — Other Ambulatory Visit: Payer: Self-pay | Admitting: Cardiology

## 2022-01-22 ENCOUNTER — Ambulatory Visit: Payer: BC Managed Care – PPO | Attending: General Practice | Admitting: General Practice

## 2022-01-22 VITALS — BP 114/66 | HR 52 | Ht 67.5 in | Wt 161.0 lb

## 2022-01-22 DIAGNOSIS — I255 Ischemic cardiomyopathy: Secondary | ICD-10-CM

## 2022-01-22 DIAGNOSIS — I10 Essential (primary) hypertension: Secondary | ICD-10-CM

## 2022-01-22 DIAGNOSIS — I2510 Atherosclerotic heart disease of native coronary artery without angina pectoris: Secondary | ICD-10-CM

## 2022-01-22 DIAGNOSIS — E785 Hyperlipidemia, unspecified: Secondary | ICD-10-CM | POA: Diagnosis not present

## 2022-01-22 DIAGNOSIS — G4733 Obstructive sleep apnea (adult) (pediatric): Secondary | ICD-10-CM

## 2022-01-22 NOTE — Patient Instructions (Addendum)
Medication Instructions:  The current medical regimen is effective;  continue present plan and medications as directed. Please refer to the Current Medication list given to you today.  *If you need a refill on your cardiac medications before your next appointment, please call your pharmacy*  Lab Work: none If you have labs (blood work) drawn today and your tests are completely normal, you will receive your results only by:  [place] (if you have MyChart) OR A paper copy in the mail If you have any lab test that is abnormal or we need to change your treatment, we will call you to review the results.   Testing/Procedures: none   Follow-Up: At [facility], you and your health needs are our priority.  As part of our continuing mission to provide you with exceptional heart care, we have created designated Provider Care Teams.  These Care Teams include your primary Cardiologist (physician) and Advanced Practice Providers (APPs -  Physician Assistants and Nurse Practitioners) who all work together to provide you with the care you need, when you need it.  Your next appointment:   12 month(s)  The format for your next appointment:   In Person  Provider:   Kirk Ruths, MD    Other Instructions PLEASE READ AND FOLLOW ATTACHED  SALTY 6   Increase physical activity as tolerated  Important Information About Sugar

## 2022-02-02 ENCOUNTER — Other Ambulatory Visit: Payer: Self-pay | Admitting: Cardiology

## 2022-02-11 ENCOUNTER — Other Ambulatory Visit: Payer: Self-pay | Admitting: Cardiology

## 2022-02-13 ENCOUNTER — Other Ambulatory Visit: Payer: Self-pay | Admitting: Cardiology

## 2022-04-16 ENCOUNTER — Ambulatory Visit: Payer: BC Managed Care – PPO | Admitting: Family Medicine

## 2022-04-16 ENCOUNTER — Encounter: Payer: Self-pay | Admitting: Family Medicine

## 2022-04-16 VITALS — BP 124/70 | HR 58 | Temp 97.7°F | Resp 18 | Ht 67.5 in | Wt 163.0 lb

## 2022-04-16 DIAGNOSIS — J069 Acute upper respiratory infection, unspecified: Secondary | ICD-10-CM | POA: Diagnosis not present

## 2022-04-16 MED ORDER — GUAIFENESIN ER 600 MG PO TB12: 1200.0000 mg | ORAL_TABLET | Freq: Two times a day (BID) | ORAL | 0 refills | Status: DC

## 2022-04-16 MED ORDER — BENZONATATE 100 MG PO CAPS: 100.0000 mg | ORAL_CAPSULE | Freq: Two times a day (BID) | ORAL | 0 refills | Status: DC | PRN

## 2022-04-16 MED ORDER — AMOXICILLIN-POT CLAVULANATE 875-125 MG PO TABS: 1.0000 | ORAL_TABLET | Freq: Two times a day (BID) | ORAL | 0 refills | Status: AC

## 2022-04-16 NOTE — Patient Instructions (Addendum)
Start Mucinex and Tessalon Continue supportive measures including rest, hydration, humidifier use, steam showers, warm compresses to sinuses, warm liquids with lemon and honey, and over-the-counter cough, cold, and analgesics as needed.  If not making progress over the weekend or if symptoms start to worse, you can start the antibiotic.   Please contact office for follow-up if symptoms do not improve or worsen. Seek emergency care if symptoms become severe.   The following information is provided as a Human resources officer for ADULT patients only and does NOT take into account PREGNANCY, ALLERGIES, LIVER CONDITIONS, KIDNEY CONDITIONS, GASTROINTESTINAL CONDITIONS, OR PRESCRIPTION MEDICATION INTERACTIONS. Please be sure to ask your provider if the following are safe to take with your specific medical history, conditions, or current medication regimen if you are unsure.   Adult Basic Symptom Management  Congestion: Guaifenesin (Mucinex)- follow directions on packaging with a maximum dose of '2400mg'$  in a 24 hour period.  Pain/Fever: Ibuprofen '200mg'$  - '400mg'$  every 4-6 hours as needed (MAX '1200mg'$  in a 24 hour period) Pain/Fever: Tylenol '500mg'$  -'1000mg'$  every 6-8 hours as needed (MAX '3000mg'$  in a 24 hour period)  Cough: Dextromethorphan (Delsym)- follow directions on packing with a maximum dose of '120mg'$  in a 24 hour period.  Nasal Stuffiness: Saline nasal spray and/or Nettie Pot with sterile saline solution  Runny Nose: Fluticasone nasal spray (Flonase) OR Mometasone nasal spray (Nasonex) OR Triamcinolone Acetonide nasal spray (Nasacort)- follow directions on the packaging  Pain/Pressure: Warm washcloth to the face  Sore Throat: Warm salt water gargles  If you have allergies, you may also consider taking an oral antihistamine (like Zyrtec or Claritin) as these may also help with your symptoms.  **Many medications will have more than one ingredient, be sure you are reading the packaging carefully and not  taking more than one dose of the same kind of medication at the same time or too close together. It is OK to use formulas that have all of the ingredients you want, but do not take them in a combined medication and as separate dose too close together. If you have any questions, the pharmacist will be happy to help you decide what is safe.

## 2022-04-16 NOTE — Progress Notes (Signed)
Acute Office Visit  Subjective:     Patient ID: Ricky Harrison, male    DOB: 1955-05-20, 67 y.o.   MRN: [id number]  Chief Complaint  Patient presents with   Cough    Congestion ...      Patient is in today for cough and nasal congestion.   Patient states he had URI symptoms about a month ago; severe enough to miss 2 days of work. Negative COVID test x2. Symptoms seemed to improve and were resolved for a week or so before returning earlier this week. He denies any fevers, chills, ear pain, chest pain, dyspnea, nausea, vomiting, diarrhea.      All review of systems negative except what is listed in the HPI      Objective:    BP 124/70   Pulse (!) 58   Temp 97.7 F (36.5 C)   Resp 18   Ht 5' 7.5" (1.715 m)   Wt 163 lb (73.9 kg)   SpO2 96%   BMI 25.15 kg/m    Physical Exam Vitals reviewed.  Constitutional:      Appearance: Normal appearance.  HENT:     Head: Normocephalic and atraumatic.     Right Ear: Tympanic membrane normal.     Left Ear: Tympanic membrane normal.     Nose: Congestion present.  Eyes:     Conjunctiva/sclera: Conjunctivae normal.  Cardiovascular:     Rate and Rhythm: Normal rate and regular rhythm.     Pulses: Normal pulses.     Heart sounds: Normal heart sounds.  Pulmonary:     Effort: Pulmonary effort is normal.     Breath sounds: Normal breath sounds. No wheezing, rhonchi or rales.  Skin:    General: Skin is warm and dry.  Neurological:     Mental Status: He is alert and oriented to person, place, and time.  Psychiatric:        Mood and Affect: Mood normal.        Behavior: Behavior normal.        Thought Content: Thought content normal.        Judgment: Judgment normal.     No results found for any visits on 04/16/22.      Assessment & Plan:   Problem List Items Addressed This Visit   None Visit Diagnoses     Viral URI with cough    -  Primary Start Mucinex and Tessalon Continue supportive measures including rest,  hydration, humidifier use, steam showers, warm compresses to sinuses, warm liquids with lemon and honey, and over-the-counter cough, cold, and analgesics as needed.  If not making progress over the weekend or if symptoms start to worse, you can start the antibiotic.    Relevant Medications   benzonatate (TESSALON) 100 MG capsule   guaiFENesin (MUCINEX) 600 MG 12 hr tablet   amoxicillin-clavulanate (AUGMENTIN) 875-125 MG tablet       Meds ordered this encounter  Medications   benzonatate (TESSALON) 100 MG capsule    Sig: Take 1 capsule (100 mg total) by mouth 2 (two) times daily as needed for cough.    Dispense:  20 capsule    Refill:  0    Order Specific Question:   Supervising Provider    Answer:   Penni Homans A [[id number]]   guaiFENesin (MUCINEX) 600 MG 12 hr tablet    Sig: Take 2 tablets (1,200 mg total) by mouth 2 (two) times daily.    Dispense:  30 tablet  Refill:  0    Order Specific Question:   Supervising Provider    Answer:   Penni Homans A [[id number]]   amoxicillin-clavulanate (AUGMENTIN) 875-125 MG tablet    Sig: Take 1 tablet by mouth 2 (two) times daily for 7 days.    Dispense:  14 tablet    Refill:  0    Order Specific Question:   Supervising Provider    Answer:   Penni Homans A [[id number]]    Return if symptoms worsen or fail to improve.  Terrilyn Saver, NP

## 2022-05-07 ENCOUNTER — Other Ambulatory Visit: Payer: Self-pay | Admitting: Cardiology

## 2022-05-22 NOTE — Progress Notes (Signed)
HPI: Follow-up coronary artery disease. Patient seen in July 2018 with symptoms concerning for unstable angina. Cardiac catheterization July 2018 showed ejection fraction 45-50%, occluded LAD and no other obstructive disease noted. Patient had drug-eluting stent to his LAD at that time.  Echocardiogram February 2021 showed normal LV function.  Since last seen, the patient denies any dyspnea on exertion, orthopnea, PND, pedal edema, palpitations, syncope or chest pain.   Current Outpatient Medications  Medication Sig Dispense Refill   atorvastatin (LIPITOR) 80 MG tablet TAKE 1 TABLET BY MOUTH DAILY AT 6 PM. 90 tablet 2   hydrochlorothiazide (MICROZIDE) 12.5 MG capsule TAKE 1 CAPSULE BY MOUTH EVERY DAY 90 capsule 3   losartan (COZAAR) 50 MG tablet Take 1 tablet (50 mg total) by mouth daily. 90 tablet 3   metoprolol tartrate (LOPRESSOR) 25 MG tablet TAKE HALF TABLET BY MOUTH TWO TIMES A DAY 90 tablet 3   Multiple Vitamin (MULTIVITAMIN WITH MINERALS) TABS Take 1 tablet by mouth daily. Takes on Mondays.     VASCEPA 1 g capsule TAKE 2 CAPSULES BY MOUTH 2 TIMES DAILY. 120 capsule 11   benzonatate (TESSALON) 100 MG capsule Take 1 capsule (100 mg total) by mouth 2 (two) times daily as needed for cough. (Patient not taking: Reported on 06/03/2022) 20 capsule 0   guaiFENesin (MUCINEX) 600 MG 12 hr tablet Take 2 tablets (1,200 mg total) by mouth 2 (two) times daily. (Patient not taking: Reported on 06/03/2022) 30 tablet 0   nitroGLYCERIN (NITROSTAT) 0.4 MG SL tablet Place 1 tablet (0.4 mg total) under the tongue every 5 (five) minutes as needed for chest pain. (Patient not taking: Reported on 06/03/2022) 25 tablet 2   No current facility-administered medications for this visit.     Past Medical History:  Diagnosis Date   Basal cell carcinoma of face 2006   History of kidney stones    Hypertension    Inguinal hernia, right    Dr. Derrell Lolling; "still flares up sometimes" (08/19/2016)   Pneumonia ~ 1979    "walking"   Sleep apnea    "had mask; couldn't tolerate it; I usually use nasal strips now" (08/19/2016)    Past Surgical History:  Procedure Laterality Date   CORONARY STENT INTERVENTION N/A 08/20/2016   Procedure: Coronary Stent Intervention;  Surgeon: Corky Crafts, MD;  Location: [facility] INVASIVE CV LAB;  Service: Cardiovascular;  Laterality: N/A;   CYSTOSCOPY W/ STONE MANIPULATION  X 2   INGUINAL HERNIA REPAIR  01/01/2012   Procedure: LAPAROSCOPIC INGUINAL HERNIA;  Surgeon: Ernestene Mention, MD;  Location: [facility] ORS;  Service: General;  Laterality: Left;   INGUINAL HERNIA REPAIR Right 2016   Dr. Nanda Quinton HERNIA REPAIR Left    INSERTION OF MESH  01/01/2012   Procedure: INSERTION OF MESH;  Surgeon: Ernestene Mention, MD;  Location: [facility]S;  Service: General;  Laterality: Left;   LEFT HEART CATH AND CORONARY ANGIOGRAPHY N/A 08/20/2016   Procedure: Left Heart Cath and Coronary Angiography;  Surgeon: Corky Crafts, MD;  Location: [facility] INVASIVE CV LAB;  Service: Cardiovascular;  Laterality: N/A;   LITHOTRIPSY  2008   5 times total - Dr. Isabel Caprice   LITHOTRIPSY  X ~ 5   MOHS SURGERY  2006   basal cell; face   NASAL SEPTUM SURGERY  2004   Dr. Annalee Genta    Social History   Socioeconomic History   Marital status: Married    Spouse name: Not on file  Number of children: 2   Years of education: Not on file   Highest education level: Not on file  Occupational History   Not on file  Tobacco Use   Smoking status: Never   Smokeless tobacco: Never  Vaping Use   Vaping Use: Never used  Substance and Sexual Activity   Alcohol use: Yes    Alcohol/week: 5.0 standard drinks of alcohol    Types: 5 Shots of liquor per week   Drug use: No   Sexual activity: Yes  Other Topics Concern   Not on file  Social History Narrative   Not on file   Social Determinants of Health   Financial Resource Strain: Not on file  Food Insecurity: Not on file  Transportation Needs: Not on file   Physical Activity: Not on file  Stress: Not on file  Social Connections: Not on file  Intimate Partner Violence: Not on file    Family History  Problem Relation Age of Onset   Cancer Mother        throat   Heart attack Father 49    ROS: no fevers or chills, productive cough, hemoptysis, dysphasia, odynophagia, melena, hematochezia, dysuria, hematuria, rash, seizure activity, orthopnea, PND, pedal edema, claudication. Remaining systems are negative.  Physical Exam: Well-developed well-nourished in no acute distress.  Skin is warm and dry.  HEENT is normal.  Neck is supple.  Chest is clear to auscultation with normal expansion.  Cardiovascular exam is regular rate and rhythm.  Abdominal exam nontender or distended. No masses palpated. Extremities show no edema. neuro grossly intact   A/P  1 coronary artery disease with history of PCI of the LAD-patient denies chest pain.  LV function is normal.  Continue statin.  Resume aspirin 81 mg daily.  2 history of cardiomyopathy-echocardiogram most recently shows preserved LV function.  Continue ARB and beta-blocker.  3 hypertension-patient's blood pressure is mildly elevated; I have asked him to track this at home and we will advance losartan if necessary.  Check potassium and renal function.  4 hyperlipidemia-continue statin.  Check lipids and liver.  Olga Millers, MD

## 2022-06-02 DIAGNOSIS — L814 Other melanin hyperpigmentation: Secondary | ICD-10-CM | POA: Diagnosis not present

## 2022-06-02 DIAGNOSIS — D1801 Hemangioma of skin and subcutaneous tissue: Secondary | ICD-10-CM | POA: Diagnosis not present

## 2022-06-02 DIAGNOSIS — L821 Other seborrheic keratosis: Secondary | ICD-10-CM | POA: Diagnosis not present

## 2022-06-02 DIAGNOSIS — L570 Actinic keratosis: Secondary | ICD-10-CM | POA: Diagnosis not present

## 2022-06-03 ENCOUNTER — Encounter: Payer: Self-pay | Admitting: Cardiology

## 2022-06-03 ENCOUNTER — Ambulatory Visit: Payer: Medicare Other | Attending: Cardiology | Admitting: Cardiology

## 2022-06-03 VITALS — BP 138/86 | HR 95 | Ht 67.5 in | Wt 164.6 lb

## 2022-06-03 DIAGNOSIS — I255 Ischemic cardiomyopathy: Secondary | ICD-10-CM | POA: Diagnosis not present

## 2022-06-03 DIAGNOSIS — E785 Hyperlipidemia, unspecified: Secondary | ICD-10-CM

## 2022-06-03 DIAGNOSIS — I2510 Atherosclerotic heart disease of native coronary artery without angina pectoris: Secondary | ICD-10-CM | POA: Diagnosis not present

## 2022-06-03 DIAGNOSIS — I10 Essential (primary) hypertension: Secondary | ICD-10-CM

## 2022-06-03 MED ORDER — ASPIRIN 81 MG PO TBEC: 81.0000 mg | DELAYED_RELEASE_TABLET | Freq: Every day | ORAL | 3 refills | Status: AC

## 2022-06-03 NOTE — Patient Instructions (Signed)
Medication Instructions:   START ASPIRIN 81 MG ONCE DAILY  *If you need a refill on your cardiac medications before your next appointment, please call your pharmacy*  Follow-Up: At [facility], you and your health needs are our priority.  As part of our continuing mission to provide you with exceptional heart care, we have created designated Provider Care Teams.  These Care Teams include your primary Cardiologist (physician) and Advanced Practice Providers (APPs -  Physician Assistants and Nurse Practitioners) who all work together to provide you with the care you need, when you need it.  We recommend signing up for the patient portal called "MyChart".  Sign up information is provided on this After Visit Summary.  MyChart is used to connect with patients for Virtual Visits (Telemedicine).  Patients are able to view lab/test results, encounter notes, upcoming appointments, etc.  Non-urgent messages can be sent to your provider as well.   To learn more about what you can do with MyChart, go to [email or web address].    Your next appointment:   12 month(s)  Provider:   Brian Crenshaw, MD      

## 2022-06-04 LAB — LIPID PANEL
Chol/HDL Ratio: 2.7 ratio (ref 0.0–5.0)
Cholesterol, Total: 122 mg/dL (ref 100–199)
HDL: 45 mg/dL (ref >39)
LDL Chol Calc (NIH): 49 mg/dL (ref 0–99)
Triglycerides: 169 mg/dL — ABNORMAL HIGH (ref 0–149)
VLDL Cholesterol Cal: 28 mg/dL (ref 5–40)

## 2022-06-04 LAB — COMPREHENSIVE METABOLIC PANEL
ALT: 44 IU/L (ref 0–44)
AST: 27 IU/L (ref 0–40)
Albumin/Globulin Ratio: 2.1 (ref 1.2–2.2)
Albumin: 4.4 g/dL (ref 3.9–4.9)
Alkaline Phosphatase: 68 IU/L (ref 44–121)
BUN/Creatinine Ratio: 14 (ref 10–24)
BUN: 14 mg/dL (ref 8–27)
Bilirubin Total: 0.7 mg/dL (ref 0.0–1.2)
CO2: 25 mmol/L (ref 20–29)
Calcium: 9.5 mg/dL (ref 8.6–10.2)
Chloride: 102 mmol/L (ref 96–106)
Creatinine, Ser: 1 mg/dL (ref 0.76–1.27)
Globulin, Total: 2.1 g/dL (ref 1.5–4.5)
Glucose: 99 mg/dL (ref 70–99)
Potassium: 4.7 mmol/L (ref 3.5–5.2)
Sodium: 141 mmol/L (ref 134–144)
Total Protein: 6.5 g/dL (ref 6.0–8.5)
eGFR: 84 mL/min/{1.73_m2} (ref >59)

## 2022-06-30 ENCOUNTER — Encounter: Payer: Self-pay | Admitting: Cardiology

## 2022-06-30 MED ORDER — ICOSAPENT ETHYL 1 G PO CAPS: ORAL_CAPSULE | ORAL | 11 refills | Status: DC

## 2022-09-07 ENCOUNTER — Encounter: Payer: Self-pay | Admitting: Family Medicine

## 2022-09-07 ENCOUNTER — Telehealth: Payer: Self-pay | Admitting: Family Medicine

## 2022-09-07 NOTE — Telephone Encounter (Signed)
Error

## 2022-09-10 DIAGNOSIS — M7711 Lateral epicondylitis, right elbow: Secondary | ICD-10-CM | POA: Diagnosis not present

## 2022-09-10 DIAGNOSIS — M25521 Pain in right elbow: Secondary | ICD-10-CM | POA: Diagnosis not present

## 2022-09-24 ENCOUNTER — Encounter (INDEPENDENT_AMBULATORY_CARE_PROVIDER_SITE_OTHER): Payer: Self-pay

## 2022-10-13 ENCOUNTER — Encounter: Payer: Self-pay | Admitting: Family Medicine

## 2022-10-13 ENCOUNTER — Ambulatory Visit (INDEPENDENT_AMBULATORY_CARE_PROVIDER_SITE_OTHER): Payer: Medicare Other | Admitting: Family Medicine

## 2022-10-13 ENCOUNTER — Other Ambulatory Visit: Payer: Self-pay | Admitting: Family Medicine

## 2022-10-13 VITALS — BP 120/80 | HR 47 | Temp 98.9°F | Ht 67.5 in | Wt 168.1 lb

## 2022-10-13 DIAGNOSIS — Z1211 Encounter for screening for malignant neoplasm of colon: Secondary | ICD-10-CM | POA: Diagnosis not present

## 2022-10-13 DIAGNOSIS — Z5320 Procedure and treatment not carried out because of patient's decision for unspecified reasons: Secondary | ICD-10-CM

## 2022-10-13 DIAGNOSIS — Z0000 Encounter for general adult medical examination without abnormal findings: Secondary | ICD-10-CM

## 2022-10-13 DIAGNOSIS — Z9861 Coronary angioplasty status: Secondary | ICD-10-CM

## 2022-10-13 DIAGNOSIS — E781 Pure hyperglyceridemia: Secondary | ICD-10-CM

## 2022-10-13 DIAGNOSIS — I2510 Atherosclerotic heart disease of native coronary artery without angina pectoris: Secondary | ICD-10-CM

## 2022-10-13 LAB — COMPREHENSIVE METABOLIC PANEL
ALT: 43 U/L (ref 0–53)
AST: 28 U/L (ref 0–37)
Albumin: 4.3 g/dL (ref 3.5–5.2)
Alkaline Phosphatase: 65 U/L (ref 39–117)
BUN: 19 mg/dL (ref 6–23)
CO2: 29 meq/L (ref 19–32)
Calcium: 10 mg/dL (ref 8.4–10.5)
Chloride: 102 meq/L (ref 96–112)
Creatinine, Ser: 1.2 mg/dL (ref 0.40–1.50)
GFR: 63.52 mL/min (ref >60.00)
Glucose, Bld: 91 mg/dL (ref 70–99)
Potassium: 4.6 meq/L (ref 3.5–5.1)
Sodium: 138 meq/L (ref 135–145)
Total Bilirubin: 0.9 mg/dL (ref 0.2–1.2)
Total Protein: 6.8 g/dL (ref 6.0–8.3)

## 2022-10-13 LAB — CBC
HCT: 44.9 % (ref 39.0–52.0)
Hemoglobin: 15.1 g/dL (ref 13.0–17.0)
MCHC: 33.6 g/dL (ref 30.0–36.0)
MCV: 89.7 fl (ref 78.0–100.0)
Platelets: 203 10*3/uL (ref 150.0–400.0)
RBC: 5.01 Mil/uL (ref 4.22–5.81)
RDW: 13.1 % (ref 11.5–15.5)
WBC: 7.1 10*3/uL (ref 4.0–10.5)

## 2022-10-13 LAB — LIPID PANEL
Cholesterol: 124 mg/dL (ref 0–200)
HDL: 39.5 mg/dL (ref >39.00)
LDL Cholesterol: 37 mg/dL (ref 0–99)
NonHDL: 84.54
Total CHOL/HDL Ratio: 3
Triglycerides: 237 mg/dL — ABNORMAL HIGH (ref 0.0–149.0)
VLDL: 47.4 mg/dL — ABNORMAL HIGH (ref 0.0–40.0)

## 2022-10-13 MED ORDER — NITROGLYCERIN 0.4 MG SL SUBL: 0.4000 mg | SUBLINGUAL_TABLET | SUBLINGUAL | 2 refills | Status: AC | PRN

## 2022-10-13 NOTE — Addendum Note (Signed)
Addended by: Scharlene Gloss B on: 10/13/2022 02:27 PM   Modules accepted: Orders

## 2022-10-13 NOTE — Progress Notes (Signed)
Chief Complaint  Patient presents with   Annual Exam    Refill vascepa and nitro    [name] is here for a complete physical.   His last physical was >1 year ago.  Current diet: in general, a "healthy" diet.   Current exercise: lifting, cardio Weight trend: stable Fatigue out of ordinary? No. Seat belt? Yes.   Advanced directive? Yes  Health maintenance Shingrix- Yes Colonoscopy- No Tetanus- No Hep C- Yes Pneumonia vaccine- No  Past Medical History:  Diagnosis Date   Basal cell carcinoma of face 2006   History of kidney stones    Hypertension    Inguinal hernia, right    Dr. Derrell Lolling; "still flares up sometimes" (08/19/2016)   Pneumonia ~ 1979   "walking"   Sleep apnea    "had mask; couldn't tolerate it; I usually use nasal strips now" (08/19/2016)     Past Surgical History:  Procedure Laterality Date   CORONARY STENT INTERVENTION N/A 08/20/2016   Procedure: Coronary Stent Intervention;  Surgeon: Corky Crafts, MD;  Location: [facility] INVASIVE CV LAB;  Service: Cardiovascular;  Laterality: N/A;   CYSTOSCOPY W/ STONE MANIPULATION  X 2   INGUINAL HERNIA REPAIR  01/01/2012   Procedure: LAPAROSCOPIC INGUINAL HERNIA;  Surgeon: Ernestene Mention, MD;  Location: [facility];  Service: General;  Laterality: Left;   INGUINAL HERNIA REPAIR Right 2016   Dr. Nanda Quinton HERNIA REPAIR Left    INSERTION OF MESH  01/01/2012   Procedure: INSERTION OF MESH;  Surgeon: Ernestene Mention, MD;  Location: [facility];  Service: General;  Laterality: Left;   LEFT HEART CATH AND CORONARY ANGIOGRAPHY N/A 08/20/2016   Procedure: Left Heart Cath and Coronary Angiography;  Surgeon: Corky Crafts, MD;  Location: [facility] INVASIVE CV LAB;  Service: Cardiovascular;  Laterality: N/A;   LITHOTRIPSY  2008   5 times total - Dr. Isabel Caprice   LITHOTRIPSY  X ~ 5   MOHS SURGERY  2006   basal cell; face   NASAL SEPTUM SURGERY  2004   Dr. Annalee Genta    Medications  Current Outpatient Medications  on File Prior to Visit  Medication Sig Dispense Refill   aspirin EC 81 MG tablet Take 1 tablet (81 mg total) by mouth daily. Swallow whole. 90 tablet 3   atorvastatin (LIPITOR) 80 MG tablet TAKE 1 TABLET BY MOUTH DAILY AT 6 PM. 90 tablet 2   hydrochlorothiazide (MICROZIDE) 12.5 MG capsule TAKE 1 CAPSULE BY MOUTH EVERY DAY 90 capsule 3   icosapent Ethyl (VASCEPA) 1 g capsule TAKE 2 CAPSULES BY MOUTH 2 TIMES DAILY. 120 capsule 11   losartan (COZAAR) 50 MG tablet Take 1 tablet (50 mg total) by mouth daily. 90 tablet 3   metoprolol tartrate (LOPRESSOR) 25 MG tablet TAKE HALF TABLET BY MOUTH TWO TIMES A DAY 90 tablet 3   Multiple Vitamin (MULTIVITAMIN WITH MINERALS) TABS Take 1 tablet by mouth daily. Takes on Mondays.     nitroGLYCERIN (NITROSTAT) 0.4 MG SL tablet Place 1 tablet (0.4 mg total) under the tongue every 5 (five) minutes as needed for chest pain. (Patient not taking: Reported on 10/13/2022) 25 tablet 2   Allergies Allergies  Allergen Reactions   Sulfonamide Derivatives Rash    At injection site. Made the entire vein that was injected red and spread up the arm.    Family History Family History  Problem Relation Age of Onset   Cancer Mother  throat   Heart attack Father 67    Review of Systems: Constitutional:  no fevers Eye:  no recent significant change in vision Ears:  No changes in hearing Nose/Mouth/Throat:  no complaints of nasal congestion, no sore throat Cardiovascular: no chest pain Respiratory:  No shortness of breath Gastrointestinal:  No change in bowel habits GU:  No frequency Integumentary:  no abnormal skin lesions reported Neurologic:  no headaches Endocrine:  denies unexplained weight changes  Exam BP 120/80 (BP Location: Left Arm, Patient Position: Sitting, Cuff Size: Normal)   Pulse (!) 47   Temp 98.9 F (37.2 C) (Oral)   Ht 5' 7.5" (1.715 m)   Wt 168 lb 2 oz (76.3 kg)   SpO2 98%   BMI 25.94 kg/m  General:  well developed, well nourished,  in no apparent distress Skin:  no significant moles, warts, or growths Head:  no masses, lesions, or tenderness Eyes:  pupils equal and round, sclera anicteric without injection Ears:  canals without lesions, TMs shiny without retraction, no obvious effusion, no erythema Nose:  nares patent, mucosa normal Throat/Pharynx:  lips and gingiva without lesion; tongue and uvula midline; non-inflamed pharynx; no exudates or postnasal drainage Lungs:  clear to auscultation, breath sounds equal bilaterally, no respiratory distress Cardio:  regular rhythm, bradycardic, no LE edema or bruits Rectal: sphincter of good tone, prostate was slightly enlarged without nodules, stool present in rectal vault.  GI: BS+, S, NT, ND, no masses or organomegaly Musculoskeletal:  symmetrical muscle groups noted without atrophy or deformity Neuro:  gait normal; deep tendon reflexes normal and symmetric Psych: well oriented with normal range of affect and appropriate judgment/insight  Assessment and Plan  Well adult exam  Colonoscopy refused  CAD S/P percutaneous coronary angioplasty- PCI +DES to prox LAD - Plan: CBC, Comprehensive metabolic panel, Lipid panel   Well 67 y.o. male. Counseled on diet and exercise. Tetanus booster rec'd to get at the pharmacy. PCV20 to be done at the pharmacy. FIT collected today w rectal exam, hopefully this works well. Discussed Cologard and colonoscopy which he politely declines.  Advanced directive form requested today.  Other orders as above. Follow up in 1 yr.  The patient voiced understanding and agreement to the plan.  Jilda Roche [place], DO 10/13/22 10:49 AM

## 2022-10-13 NOTE — Patient Instructions (Addendum)
Give [place] 2-3 business days to get the results of your labs back.   Keep the diet clean and stay active.  I recommend getting the flu shot in mid October. This suggestion would change if the CDC comes out with a different recommendation.   Please get your tetanus booster at the pharmacy.   Please get me a copy of your advanced directive form at your convenience.   Let us know if you need anything.

## 2022-10-14 ENCOUNTER — Encounter: Payer: Self-pay | Admitting: Family Medicine

## 2022-10-14 LAB — FECAL OCCULT BLOOD, GUAIAC: Fecal Occult Blood: NEGATIVE

## 2022-10-14 LAB — FECAL OCCULT BLOOD, IMMUNOCHEMICAL: Fecal Occult Bld: NEGATIVE

## 2022-11-06 ENCOUNTER — Telehealth (INDEPENDENT_AMBULATORY_CARE_PROVIDER_SITE_OTHER): Payer: Medicare Other | Admitting: Family Medicine

## 2022-11-06 ENCOUNTER — Encounter: Payer: Self-pay | Admitting: Family Medicine

## 2022-11-06 DIAGNOSIS — J069 Acute upper respiratory infection, unspecified: Secondary | ICD-10-CM | POA: Diagnosis not present

## 2022-11-06 MED ORDER — BENZONATATE 200 MG PO CAPS
200.0000 mg | ORAL_CAPSULE | Freq: Two times a day (BID) | ORAL | 0 refills | Status: DC | PRN
Start: 2022-11-06 — End: 2023-09-30

## 2022-11-06 MED ORDER — AZITHROMYCIN 250 MG PO TABS: ORAL_TABLET | ORAL | 0 refills | Status: DC

## 2022-11-06 NOTE — Progress Notes (Signed)
CC: URI  Ricky Harrison here for URI complaints. We are interacting via web portal for an electronic face-to-face visit. I verified patient's ID using 2 identifiers. Patient agreed to proceed with visit via this method. Patient is at home, I am at office. Patient and I are present for visit.   Duration: 2 days  Associated symptoms: sinus congestion, rhinorrhea, very slight shortness of breath, chest tightness, and coughing Denies: sinus congestion, sinus pain, rhinorrhea, itchy watery eyes, ear pain, ear drainage, wheezing, myalgia, and fevers Treatment to date: Coricidin  Sick contacts: No; was on a flight from [place] 4 d ago  Past Medical History:  Diagnosis Date   Basal cell carcinoma of face 2006   History of kidney stones    Hypertension    Inguinal hernia, right    Dr. Derrell Lolling; "still flares up sometimes" (08/19/2016)   Pneumonia ~ 1979   "walking"   Sleep apnea    "had mask; couldn't tolerate it; I usually use nasal strips now" (08/19/2016)    Objective No conversational dyspnea Age appropriate judgment and insight Nml affect and mood   Viral URI with cough - Plan: benzonatate (TESSALON) 200 MG capsule  Cough med as above. Pocket rx for zpak if no better in 2 d. Continue to push fluids, practice good hand hygiene, cover mouth when coughing. F/u prn. If starting to experience fevers, shaking, or shortness of breath, seek immediate care. Pt voiced understanding and agreement to the plan.  Jilda Roche [place], DO 11/06/22 3:47 PM

## 2022-11-24 ENCOUNTER — Other Ambulatory Visit (INDEPENDENT_AMBULATORY_CARE_PROVIDER_SITE_OTHER): Payer: Medicare Other

## 2022-11-24 DIAGNOSIS — E781 Pure hyperglyceridemia: Secondary | ICD-10-CM

## 2022-11-24 LAB — LIPID PANEL
Cholesterol: 104 mg/dL (ref 0–200)
HDL: 39.2 mg/dL (ref >39.00)
LDL Cholesterol: 36 mg/dL (ref 0–99)
NonHDL: 64.62
Total CHOL/HDL Ratio: 3
Triglycerides: 145 mg/dL (ref 0.0–149.0)
VLDL: 29 mg/dL (ref 0.0–40.0)

## 2022-12-03 DIAGNOSIS — L570 Actinic keratosis: Secondary | ICD-10-CM | POA: Diagnosis not present

## 2022-12-03 DIAGNOSIS — L821 Other seborrheic keratosis: Secondary | ICD-10-CM | POA: Diagnosis not present

## 2022-12-03 DIAGNOSIS — Z85828 Personal history of other malignant neoplasm of skin: Secondary | ICD-10-CM | POA: Diagnosis not present

## 2022-12-03 DIAGNOSIS — L814 Other melanin hyperpigmentation: Secondary | ICD-10-CM | POA: Diagnosis not present

## 2023-01-11 ENCOUNTER — Encounter: Payer: Self-pay | Admitting: Pharmacist

## 2023-01-11 ENCOUNTER — Other Ambulatory Visit: Payer: Self-pay | Admitting: Pharmacist

## 2023-01-11 MED ORDER — LOSARTAN POTASSIUM 50 MG PO TABS: 50.0000 mg | ORAL_TABLET | Freq: Every day | ORAL | 1 refills | Status: DC

## 2023-01-11 MED ORDER — HYDROCHLOROTHIAZIDE 12.5 MG PO CAPS: 12.5000 mg | ORAL_CAPSULE | Freq: Every day | ORAL | 1 refills | Status: DC

## 2023-01-11 MED ORDER — METOPROLOL TARTRATE 25 MG PO TABS: ORAL_TABLET | ORAL | 1 refills | Status: DC

## 2023-01-11 MED ORDER — ATORVASTATIN CALCIUM 80 MG PO TABS: 80.0000 mg | ORAL_TABLET | Freq: Every day | ORAL | 1 refills | Status: DC

## 2023-01-11 NOTE — Progress Notes (Signed)
Pharmacy Quality Measure Review  This patient is appearing on a report for being at risk of failing the adherence measure for cholesterol (statin) medications this calendar year.   Medication: atorvastatin 80mg  Last fill date: [facility]/20[facility]  [facility] and [facility] transferred out to [facility] in [facility]. Called [facility] and they did not have atorvastatin Rx. Spoke with patient and he reports he has been trying to get all his prescriptions transferred to [facility] but it seems a few have been left out. He endorses he has been taking atorvastatin but will run out soon.  He also will need hydrochlorothiazide, losartan 50mg  and metoprolol tartrate 25mg  refill as well.     Contacted pharmacy to facilitate refills. and Will collaborate with provider to facilitate refill needs.  [facility] to [facility].  Atorvastatin, hydrochlorothiazide and losartan all needed refills. Patient will follow up with Dr Jens Som 05/2023 - refilled to get patient to follow up appt - #90 with 1 RF of each.   Henrene Pastor, PharmD Clinical Pharmacist [facility]

## 2023-01-11 NOTE — Telephone Encounter (Signed)
Patient needed updated Rx's sent to [facility] ([facility] is transferring [facility] but there have been some issued with getting all meds transferred)

## 2023-01-11 NOTE — Addendum Note (Signed)
Addended by: Henrene Pastor B on: 01/11/2023 09:36 AM   Modules accepted: Orders

## 2023-01-29 ENCOUNTER — Other Ambulatory Visit: Payer: Self-pay | Admitting: Cardiology

## 2023-03-19 ENCOUNTER — Encounter: Payer: Self-pay | Admitting: Cardiology

## 2023-04-07 MED ORDER — VASCEPA 1 G PO CAPS: 2.0000 g | ORAL_CAPSULE | Freq: Two times a day (BID) | ORAL | 11 refills | Status: DC

## 2023-04-07 NOTE — Addendum Note (Signed)
 Addended by: Bernita Buffy on: 04/07/2023 08:19 AM   Modules accepted: Orders

## 2023-05-03 ENCOUNTER — Telehealth: Payer: Self-pay | Admitting: Family Medicine

## 2023-05-03 NOTE — Telephone Encounter (Signed)
 Copied from CRM [id number]. Topic: Medicare AWV >> May 03, 2023 10:11 AM Payton Doughty wrote: Reason for CRM: Called LVM 05/03/2023 to schedule AWV. Please schedule Virtual or Telehealth visits ONLY.   Verlee Rossetti; Care Guide Ambulatory Clinical Support [place] [facility] Direct Dial: [phone]

## 2023-05-10 DIAGNOSIS — H5213 Myopia, bilateral: Secondary | ICD-10-CM | POA: Diagnosis not present

## 2023-05-20 DIAGNOSIS — M25512 Pain in left shoulder: Secondary | ICD-10-CM | POA: Diagnosis not present

## 2023-05-25 NOTE — Progress Notes (Signed)
 HPI: Follow-up coronary artery disease. Patient seen in July 2018 with symptoms concerning for unstable angina. Cardiac catheterization July 2018 showed ejection fraction 45-50%, occluded LAD and no other obstructive disease noted. Patient had drug-eluting stent to his LAD at that time.  Echocardiogram February 2021 showed normal LV function.  Since last seen, the patient denies any dyspnea on exertion, orthopnea, PND, pedal edema, palpitations, syncope or chest pain.    Current Outpatient Medications  Medication Sig Dispense Refill   aspirin  EC 81 MG tablet Take 1 tablet (81 mg total) by mouth daily. Swallow whole. 90 tablet 3   atorvastatin  (LIPITOR ) 80 MG tablet Take 1 tablet (80 mg total) by mouth daily. 90 tablet 1   azithromycin  (ZITHROMAX ) 250 MG tablet Take 2 tabs the first day and then 1 tab daily until you run out. 6 tablet 0   hydrochlorothiazide  (MICROZIDE ) 12.5 MG capsule Take 1 capsule (12.5 mg total) by mouth daily. 90 capsule 1   icosapent  Ethyl (VASCEPA ) 1 g capsule Take 2 capsules (2 g total) by mouth 2 (two) times daily. 120 capsule 11   losartan  (COZAAR ) 50 MG tablet Take 1 tablet (50 mg total) by mouth daily. 90 tablet 1   metoprolol  tartrate (LOPRESSOR ) 25 MG tablet TAKE HALF TABLET BY MOUTH TWO TIMES A DAY 90 tablet 1   Multiple Vitamin (MULTIVITAMIN WITH MINERALS) TABS Take 1 tablet by mouth daily. Takes on Mondays.     nitroGLYCERIN  (NITROSTAT ) 0.4 MG SL tablet Place 1 tablet (0.4 mg total) under the tongue every 5 (five) minutes as needed for chest pain. 25 tablet 2   benzonatate  (TESSALON ) 200 MG capsule Take 1 capsule (200 mg total) by mouth 2 (two) times daily as needed for cough. (Patient not taking: Reported on 06/01/2023) 20 capsule 0   No current facility-administered medications for this visit.     Past Medical History:  Diagnosis Date   Basal cell carcinoma of face 2006   History of kidney stones    Hypertension    Inguinal hernia, right    Dr.  Lauralee Poll; "still flares up sometimes" (08/19/2016)   Pneumonia ~ 1979   "walking"   Sleep apnea    "had mask; couldn't tolerate it; I usually use nasal strips now" (08/19/2016)    Past Surgical History:  Procedure Laterality Date   CORONARY STENT INTERVENTION N/A 08/20/2016   Procedure: Coronary Stent Intervention;  Surgeon: Lucendia Rusk, MD;  Location: [facility] INVASIVE CV LAB;  Service: Cardiovascular;  Laterality: N/A;   CYSTOSCOPY W/ STONE MANIPULATION  X 2   INGUINAL HERNIA REPAIR  01/01/2012   Procedure: LAPAROSCOPIC INGUINAL HERNIA;  Surgeon: Levert Ready, MD;  Location: [facility] ORS;  Service: General;  Laterality: Left;   INGUINAL HERNIA REPAIR Right 2016   Dr. Twyla Galeazzi HERNIA REPAIR Left    INSERTION OF MESH  01/01/2012   Procedure: INSERTION OF MESH;  Surgeon: Levert Ready, MD;  Location: [facility] ORS;  Service: General;  Laterality: Left;   LEFT HEART CATH AND CORONARY ANGIOGRAPHY N/A 08/20/2016   Procedure: Left Heart Cath and Coronary Angiography;  Surgeon: Lucendia Rusk, MD;  Location: [facility] INVASIVE CV LAB;  Service: Cardiovascular;  Laterality: N/A;   LITHOTRIPSY  2008   5 times total - Dr. Bosie Bye   LITHOTRIPSY  X ~ 5   MOHS SURGERY  2006   basal cell; face   NASAL SEPTUM SURGERY  2004   Dr. Melissa Spring    Social  History   Socioeconomic History   Marital status: Married    Spouse name: Not on file   Number of children: 2   Years of education: Not on file   Highest education level: Not on file  Occupational History   Not on file  Tobacco Use   Smoking status: Never   Smokeless tobacco: Never  Vaping Use   Vaping status: Never Used  Substance and Sexual Activity   Alcohol use: Yes    Alcohol/week: 5.0 standard drinks of alcohol    Types: 5 Shots of liquor per week   Drug use: No   Sexual activity: Yes  Other Topics Concern   Not on file  Social History Narrative   Not on file   Social Drivers of Health   Financial Resource Strain: Not on  file  Food Insecurity: Not on file  Transportation Needs: Not on file  Physical Activity: Not on file  Stress: Not on file  Social Connections: Not on file  Intimate Partner Violence: Not on file    Family History  Problem Relation Age of Onset   Cancer Mother        throat   Heart attack Father 66    ROS: no fevers or chills, productive cough, hemoptysis, dysphasia, odynophagia, melena, hematochezia, dysuria, hematuria, rash, seizure activity, orthopnea, PND, pedal edema, claudication. Remaining systems are negative.  Physical Exam: Well-developed well-nourished in no acute distress.  Skin is warm and dry.  HEENT is normal.  Neck is supple.  Chest is clear to auscultation with normal expansion.  Cardiovascular exam is regular rate and rhythm.  Abdominal exam nontender or distended. No masses palpated. Extremities show no edema. neuro grossly intact  EKG Interpretation Date/Time:  Tuesday June 01 2023 09:47:52 EDT Ventricular Rate:  50 PR Interval:  132 QRS Duration:  154 QT Interval:  462 QTC Calculation: 421 R Axis:   -17  Text Interpretation: Sinus bradycardia Right bundle branch block Confirmed by Alexandria Angel ([id number]) on 06/01/2023 9:49:45 AM    A/P  1 coronary artery disease with history of PCI of the LAD-No CP; continue ASA and statin.  2 history of cardiomyopathy-echocardiogram most recently shows preserved LV function.  Continue ARB and beta-blocker.  3 hypertension-BP controlled; continue present meds and follow. Check potassium and renal function.  4 hyperlipidemia-continue statin.  Check lipids and liver.  Alexandria Angel, MD

## 2023-05-31 DIAGNOSIS — M6281 Muscle weakness (generalized): Secondary | ICD-10-CM | POA: Diagnosis not present

## 2023-05-31 DIAGNOSIS — M25612 Stiffness of left shoulder, not elsewhere classified: Secondary | ICD-10-CM | POA: Diagnosis not present

## 2023-05-31 DIAGNOSIS — M7542 Impingement syndrome of left shoulder: Secondary | ICD-10-CM | POA: Diagnosis not present

## 2023-06-01 ENCOUNTER — Encounter: Payer: Self-pay | Admitting: Cardiology

## 2023-06-01 ENCOUNTER — Ambulatory Visit: Payer: Medicare Other | Attending: Cardiology | Admitting: Cardiology

## 2023-06-01 VITALS — BP 122/76 | HR 50 | Ht 67.5 in | Wt 174.0 lb

## 2023-06-01 DIAGNOSIS — E785 Hyperlipidemia, unspecified: Secondary | ICD-10-CM

## 2023-06-01 DIAGNOSIS — I10 Essential (primary) hypertension: Secondary | ICD-10-CM

## 2023-06-01 DIAGNOSIS — I255 Ischemic cardiomyopathy: Secondary | ICD-10-CM | POA: Diagnosis not present

## 2023-06-01 DIAGNOSIS — I2510 Atherosclerotic heart disease of native coronary artery without angina pectoris: Secondary | ICD-10-CM

## 2023-06-01 LAB — COMPREHENSIVE METABOLIC PANEL WITH GFR
ALT: 85 IU/L — ABNORMAL HIGH (ref 0–44)
AST: 39 IU/L (ref 0–40)
Albumin: 4.3 g/dL (ref 3.9–4.9)
Alkaline Phosphatase: 79 IU/L (ref 44–121)
BUN/Creatinine Ratio: 17 (ref 10–24)
BUN: 19 mg/dL (ref 8–27)
Bilirubin Total: 0.6 mg/dL (ref 0.0–1.2)
CO2: 23 mmol/L (ref 20–29)
Calcium: 9.7 mg/dL (ref 8.6–10.2)
Chloride: 102 mmol/L (ref 96–106)
Creatinine, Ser: 1.11 mg/dL (ref 0.76–1.27)
Globulin, Total: 2 g/dL (ref 1.5–4.5)
Glucose: 101 mg/dL — ABNORMAL HIGH (ref 70–99)
Potassium: 4.3 mmol/L (ref 3.5–5.2)
Sodium: 140 mmol/L (ref 134–144)
Total Protein: 6.3 g/dL (ref 6.0–8.5)
eGFR: 73 mL/min/{1.73_m2} (ref >59)

## 2023-06-01 LAB — LIPID PANEL
Chol/HDL Ratio: 2.6 ratio (ref 0.0–5.0)
Cholesterol, Total: 114 mg/dL (ref 100–199)
HDL: 44 mg/dL (ref >39)
LDL Chol Calc (NIH): 47 mg/dL (ref 0–99)
Triglycerides: 134 mg/dL (ref 0–149)
VLDL Cholesterol Cal: 23 mg/dL (ref 5–40)

## 2023-06-01 NOTE — Patient Instructions (Signed)
 Medication Instructions:   NO CHANGE  *If you need a refill on your cardiac medications before your next appointment, please call your pharmacy*  Lab Work:  Your physician recommends that you HAVE LAB WORK TODAY  If you have labs (blood work) drawn today and your tests are completely normal, you will receive your results only by: MyChart Message (if you have MyChart) OR A paper copy in the mail If you have any lab test that is abnormal or we need to change your treatment, we will call you to review the results.   Follow-Up: At [facility], you and your health needs are our priority.  As part of our continuing mission to provide you with exceptional heart care, our providers are all part of one team.  This team includes your primary Cardiologist (physician) and Advanced Practice Providers or APPs (Physician Assistants and Nurse Practitioners) who all work together to provide you with the care you need, when you need it.  Your next appointment:   12 month(s)  Provider:   Alexandria Angel, MD     We recommend signing up for the patient portal called "MyChart".  Sign up information is provided on this After Visit Summary.  MyChart is used to connect with patients for Virtual Visits (Telemedicine).  Patients are able to view lab/test results, encounter notes, upcoming appointments, etc.  Non-urgent messages can be sent to your provider as well.   To learn more about what you can do with MyChart, go to [email or web address].         1st Floor: - Lobby - Registration  - Pharmacy  - Lab - Cafe  2nd Floor: - PV Lab - Diagnostic Testing (echo, CT, nuclear med)  3rd Floor: - Vacant  4th Floor: - TCTS (cardiothoracic surgery) - AFib Clinic - Structural Heart Clinic - Vascular Surgery  - Vascular Ultrasound  5th Floor: - HeartCare Cardiology (general and EP) - Clinical Pharmacy for coumadin, hypertension, lipid, weight-loss medications, and med management  appointments    Valet parking services will be available as well.

## 2023-06-02 ENCOUNTER — Telehealth: Payer: Self-pay | Admitting: *Deleted

## 2023-06-02 DIAGNOSIS — E785 Hyperlipidemia, unspecified: Secondary | ICD-10-CM

## 2023-06-02 MED ORDER — ATORVASTATIN CALCIUM 40 MG PO TABS: 40.0000 mg | ORAL_TABLET | Freq: Every day | ORAL | 3 refills | Status: DC

## 2023-06-02 MED ORDER — EZETIMIBE 10 MG PO TABS: 10.0000 mg | ORAL_TABLET | Freq: Every day | ORAL | 3 refills | Status: DC

## 2023-06-02 NOTE — Telephone Encounter (Signed)
-----   Message from Alexandria Angel sent at 06/02/2023  6:39 AM EDT ----- LFTs mildly elevated; change lipitor  to 40 mg daily; add zetia  10 mg daily; lipids and liver 8 weeks Alexandria Angel

## 2023-06-02 NOTE — Telephone Encounter (Signed)
 Spoke with pt, aware of results and recommendations. New scripts sent to the pharmacy  Lab orders mailed to the pt

## 2023-06-09 DIAGNOSIS — M25612 Stiffness of left shoulder, not elsewhere classified: Secondary | ICD-10-CM | POA: Diagnosis not present

## 2023-06-09 DIAGNOSIS — M6281 Muscle weakness (generalized): Secondary | ICD-10-CM | POA: Diagnosis not present

## 2023-06-09 DIAGNOSIS — M7542 Impingement syndrome of left shoulder: Secondary | ICD-10-CM | POA: Diagnosis not present

## 2023-06-16 DIAGNOSIS — M25612 Stiffness of left shoulder, not elsewhere classified: Secondary | ICD-10-CM | POA: Diagnosis not present

## 2023-06-16 DIAGNOSIS — M6281 Muscle weakness (generalized): Secondary | ICD-10-CM | POA: Diagnosis not present

## 2023-06-16 DIAGNOSIS — M7542 Impingement syndrome of left shoulder: Secondary | ICD-10-CM | POA: Diagnosis not present

## 2023-07-10 ENCOUNTER — Other Ambulatory Visit: Payer: Self-pay | Admitting: Family Medicine

## 2023-07-23 ENCOUNTER — Telehealth: Payer: Self-pay | Admitting: Cardiology

## 2023-07-23 DIAGNOSIS — E785 Hyperlipidemia, unspecified: Secondary | ICD-10-CM

## 2023-07-23 MED ORDER — ATORVASTATIN CALCIUM 40 MG PO TABS: 40.0000 mg | ORAL_TABLET | Freq: Every day | ORAL | 3 refills | Status: AC

## 2023-07-23 MED ORDER — LOSARTAN POTASSIUM 50 MG PO TABS: 50.0000 mg | ORAL_TABLET | Freq: Every day | ORAL | 3 refills | Status: AC

## 2023-07-23 MED ORDER — METOPROLOL TARTRATE 25 MG PO TABS: ORAL_TABLET | ORAL | 3 refills | Status: DC

## 2023-07-23 MED ORDER — ICOSAPENT ETHYL 1 G PO CAPS
2.0000 g | ORAL_CAPSULE | Freq: Two times a day (BID) | ORAL | 11 refills | Status: AC
Start: 2023-07-23 — End: ?

## 2023-07-23 MED ORDER — EZETIMIBE 10 MG PO TABS: 10.0000 mg | ORAL_TABLET | Freq: Every day | ORAL | 3 refills | Status: AC

## 2023-07-23 MED ORDER — HYDROCHLOROTHIAZIDE 12.5 MG PO CAPS
12.5000 mg | ORAL_CAPSULE | Freq: Every day | ORAL | 3 refills | Status: DC
Start: 2023-07-23 — End: 2023-11-04

## 2023-07-23 NOTE — Telephone Encounter (Signed)
 Pt c/o medication issue:  1. Name of Medication: Hydrochlorothiazide , Losartan , Ezetimibe , Vascepa , Atorvastatin , Metoprolol      2. How are you currently taking this medication (dosage and times per day)?   3. Are you having a reaction (difficulty breathing--STAT)?   4. What is your medication issue? Patient is out of town until tomorrow nigh(Saturday). He left all this medicine at home. He wants to know if  he will alright to do with out them until he gest home or does he need them clled to a pharmacy where he is?

## 2023-07-23 NOTE — Telephone Encounter (Signed)
 Last time took meds was yesterday morning. So has missed last nights meds and morning meds so far.   Sending in refill for all prescriptions to [facility] in [place], so patient can get back on schedule with his medications.

## 2023-08-02 ENCOUNTER — Other Ambulatory Visit: Payer: Self-pay | Admitting: Cardiology

## 2023-08-02 DIAGNOSIS — E785 Hyperlipidemia, unspecified: Secondary | ICD-10-CM | POA: Diagnosis not present

## 2023-08-03 ENCOUNTER — Ambulatory Visit: Payer: Self-pay | Admitting: Cardiology

## 2023-08-03 ENCOUNTER — Telehealth: Payer: Self-pay | Admitting: Family Medicine

## 2023-08-03 LAB — HEPATIC FUNCTION PANEL
ALT: 40 IU/L (ref 0–44)
AST: 24 IU/L (ref 0–40)
Albumin: 4.5 g/dL (ref 3.9–4.9)
Alkaline Phosphatase: 61 IU/L (ref 44–121)
Bilirubin Total: 0.7 mg/dL (ref 0.0–1.2)
Bilirubin, Direct: 0.29 mg/dL (ref 0.00–0.40)
Total Protein: 6.3 g/dL (ref 6.0–8.5)

## 2023-08-03 LAB — LIPID PANEL
Chol/HDL Ratio: 1.9 ratio (ref 0.0–5.0)
Cholesterol, Total: 79 mg/dL — ABNORMAL LOW (ref 100–199)
HDL: 41 mg/dL (ref >39)
LDL Chol Calc (NIH): 19 mg/dL (ref 0–99)
Triglycerides: 100 mg/dL (ref 0–149)
VLDL Cholesterol Cal: 19 mg/dL (ref 5–40)

## 2023-08-03 LAB — SPECIMEN STATUS REPORT

## 2023-08-03 NOTE — Telephone Encounter (Signed)
 Copied from CRM [id number]. Topic: Medicare AWV >> Aug 03, 2023 11:13 AM Nathanel DEL wrote: Reason for CRM: LVM 08/03/2023 to schedule AWV. Please schedule Virtual or Telehealth visits ONLY.   Nathanel Paschal; Care Guide Ambulatory Clinical Support [facility] Direct Dial: [phone]

## 2023-08-03 NOTE — Progress Notes (Signed)
 [facility] Quality Team Note  Name: Ricky Harrison Date of Birth: January 13, 1956 MRN: [id number] Date: 08/03/2023  [facility] Quality Team has reviewed this patient's chart, please see recommendations below:  [facility] Quality Other; (CHART REVIEWED FOR COLORECTAL CANCER SCREENING. PATIENT LAST FOBT IN 2024. NEED ONE IN 2025 OR OTHER COLON SCREENING COMPLETED FOR GAP CLOSURE.)

## 2023-09-03 ENCOUNTER — Encounter: Payer: Self-pay | Admitting: Pharmacist

## 2023-09-03 NOTE — Progress Notes (Signed)
 Pharmacy Quality Measure Review  This patient is appearing on a report for being at risk of failing the adherence measure for hypertension (ACEi/ARB) medications this calendar year.   Medication: losartan  50mg  Last fill date: 04/07/2023 for 90 day supply  Reviewed recent refill history in Dr Annemarie database. Actual last refill date was 07/12/2023 for 90 day supply. Patient has a prescription from Dr Pietro on file with 3 refills remaining.   Insurance report was not up to date. No action needed at this time.   Madelin Ray, PharmD Clinical Pharmacist [facility]  Population Health [phone]

## 2023-09-07 DIAGNOSIS — M7542 Impingement syndrome of left shoulder: Secondary | ICD-10-CM | POA: Diagnosis not present

## 2023-09-07 DIAGNOSIS — M25511 Pain in right shoulder: Secondary | ICD-10-CM | POA: Diagnosis not present

## 2023-09-09 DIAGNOSIS — H903 Sensorineural hearing loss, bilateral: Secondary | ICD-10-CM | POA: Diagnosis not present

## 2023-09-15 ENCOUNTER — Encounter: Payer: Self-pay | Admitting: Emergency Medicine

## 2023-09-15 ENCOUNTER — Ambulatory Visit: Admission: EM | Admit: 2023-09-15 | Discharge: 2023-09-15 | Disposition: A | Discharge status: Home / Self Care

## 2023-09-15 ENCOUNTER — Ambulatory Visit (INDEPENDENT_AMBULATORY_CARE_PROVIDER_SITE_OTHER): Admitting: Radiology

## 2023-09-15 DIAGNOSIS — S20211A Contusion of right front wall of thorax, initial encounter: Secondary | ICD-10-CM

## 2023-09-15 DIAGNOSIS — R0781 Pleurodynia: Secondary | ICD-10-CM | POA: Diagnosis not present

## 2023-09-15 MED ORDER — DICLOFENAC SODIUM 50 MG PO TBEC: 50.0000 mg | DELAYED_RELEASE_TABLET | Freq: Two times a day (BID) | ORAL | 1 refills | Status: AC

## 2023-09-15 MED ORDER — BACLOFEN 10 MG PO TABS: 10.0000 mg | ORAL_TABLET | Freq: Three times a day (TID) | ORAL | 0 refills | Status: AC

## 2023-09-15 NOTE — ED Triage Notes (Signed)
 Pt presents after he fell about 3-94ft off a ladder today. Pt states he hit back on railing when he fell.  Most of pain Is on right side and is worse with movement

## 2023-09-15 NOTE — ED Provider Notes (Signed)
 [facility]  Note:  This document was prepared using Dragon voice recognition software and may include unintentional dictation errors.  MRN: [id number] DOB: 09/13/1955  Subjective:   Ricky Harrison is a 68 y.o. male presenting for right rib pain following a fall that occurred earlier today.  Patient reports that he fell from a ladder about 3 to 4 feet off the ground landing on a railing on his right ribs.  Patient reports direct pain over anterior right lower ribs.  Increased pain with deep breathing and movement.  Patient has taken Tylenol  rapid release with mild improvement.  Patient was concerned for possible rib fracture.  Patient denies any past history of rib fracture or injury.  No difficulty breathing, anterior chest pain, epigastric pain, dizziness, weakness  No current facility-administered medications for this encounter.  Current Outpatient Medications:    baclofen  (LIORESAL ) 10 MG tablet, Take 1 tablet (10 mg total) by mouth 3 (three) times daily., Disp: 30 each, Rfl: 0   diclofenac  (VOLTAREN ) 50 MG EC tablet, Take 1 tablet (50 mg total) by mouth 2 (two) times daily., Disp: 30 tablet, Rfl: 1   aspirin  EC 81 MG tablet, Take 1 tablet (81 mg total) by mouth daily. Swallow whole., Disp: 90 tablet, Rfl: 3   atorvastatin  (LIPITOR ) 40 MG tablet, Take 1 tablet (40 mg total) by mouth daily., Disp: 90 tablet, Rfl: 3   azithromycin  (ZITHROMAX ) 250 MG tablet, Take 2 tabs the first day and then 1 tab daily until you run out. (Patient not taking: Reported on 09/15/2023), Disp: 6 tablet, Rfl: 0   benzonatate  (TESSALON ) 200 MG capsule, Take 1 capsule (200 mg total) by mouth 2 (two) times daily as needed for cough. (Patient not taking: Reported on 06/01/2023), Disp: 20 capsule, Rfl: 0   ezetimibe  (ZETIA ) 10 MG tablet, Take 1 tablet (10 mg total) by mouth daily., Disp: 90 tablet, Rfl: 3   hydrochlorothiazide  (MICROZIDE ) 12.5 MG capsule, Take 1 capsule (12.5 mg total) by mouth  daily., Disp: 90 capsule, Rfl: 3   icosapent  Ethyl (VASCEPA ) 1 g capsule, Take 2 capsules (2 g total) by mouth 2 (two) times daily., Disp: 120 capsule, Rfl: 11   losartan  (COZAAR ) 50 MG tablet, Take 1 tablet (50 mg total) by mouth daily., Disp: 90 tablet, Rfl: 3   metoprolol  tartrate (LOPRESSOR ) 25 MG tablet, Take 1/2 (one-half) tablet by mouth twice daily, Disp: 90 tablet, Rfl: 3   Multiple Vitamin (MULTIVITAMIN WITH MINERALS) TABS, Take 1 tablet by mouth daily. Takes on Mondays., Disp: , Rfl:    nitroGLYCERIN  (NITROSTAT ) 0.4 MG SL tablet, Place 1 tablet (0.4 mg total) under the tongue every 5 (five) minutes as needed for chest pain., Disp: 25 tablet, Rfl: 2   Allergies  Allergen Reactions   Sulfonamide Derivatives Rash    At injection site. Made the entire vein that was injected red and spread up the arm.    Past Medical History:  Diagnosis Date   Basal cell carcinoma of face 2006   History of kidney stones    Hypertension    Inguinal hernia, right    Dr. Gail; still flares up sometimes (08/19/2016)   Pneumonia ~ 1979   walking   Sleep apnea    had mask; couldn't tolerate it; I usually use nasal strips now (08/19/2016)     Past Surgical History:  Procedure Laterality Date   CORONARY STENT INTERVENTION N/A 08/20/2016   Procedure: Coronary Stent Intervention;  Surgeon: Dann Candyce RAMAN, MD;  Location: [facility] INVASIVE CV LAB;  Service: Cardiovascular;  Laterality: N/A;   CYSTOSCOPY W/ STONE MANIPULATION  X 2   INGUINAL HERNIA REPAIR  01/01/2012   Procedure: LAPAROSCOPIC INGUINAL HERNIA;  Surgeon: Elon CHRISTELLA Pacini, MD;  Location: [facility];  Service: General;  Laterality: Left;   INGUINAL HERNIA REPAIR Right 2016   Dr. Pacini FONTANA HERNIA REPAIR Left    INSERTION OF MESH  01/01/2012   Procedure: INSERTION OF MESH;  Surgeon: Elon CHRISTELLA Pacini, MD;  Location: [facility];  Service: General;  Laterality: Left;   LEFT HEART CATH AND CORONARY ANGIOGRAPHY N/A 08/20/2016   Procedure:  Left Heart Cath and Coronary Angiography;  Surgeon: Dann Candyce RAMAN, MD;  Location: [facility] INVASIVE CV LAB;  Service: Cardiovascular;  Laterality: N/A;   LITHOTRIPSY  2008   5 times total - Dr. Alline   LITHOTRIPSY  X ~ 5   MOHS SURGERY  2006   basal cell; face   NASAL SEPTUM SURGERY  2004   Dr. Mable    Family History  Problem Relation Age of Onset   Cancer Mother        throat   Heart attack Father 74    Social History   Tobacco Use   Smoking status: Never   Smokeless tobacco: Never  Vaping Use   Vaping status: Never Used  Substance Use Topics   Alcohol use: Yes    Alcohol/week: 5.0 standard drinks of alcohol    Types: 5 Shots of liquor per week   Drug use: No    ROS Refer to HPI for ROS details.  Objective:    Vitals: BP 120/80 (BP Location: Right Arm)   Pulse (!) 56   Temp 97.7 F (36.5 C) (Oral)   Resp 16   SpO2 96%   Physical Exam Vitals and nursing note reviewed.  Constitutional:      General: He is not in acute distress.    Appearance: Normal appearance. He is not ill-appearing or toxic-appearing.  HENT:     Head: Normocephalic.  Cardiovascular:     Rate and Rhythm: Normal rate.  Pulmonary:     Effort: Pulmonary effort is normal. No respiratory distress.     Breath sounds: No stridor. No wheezing.  Chest:     Chest wall: Tenderness (Right lower anterior rib) present.  Musculoskeletal:        General: Normal range of motion.     Thoracic back: Normal. No swelling, spasms, tenderness or bony tenderness. Normal range of motion.     Lumbar back: Normal. No swelling, spasms, tenderness or bony tenderness. Normal range of motion.  Skin:    General: Skin is warm and dry.     Capillary Refill: Capillary refill takes less than 2 seconds.  Neurological:     General: No focal deficit present.     Mental Status: He is alert and oriented to person, place, and time.  Psychiatric:        Mood and Affect: Mood normal.        Behavior: Behavior  normal.     Procedures  No results found for this or any previous visit (from the past 24 hours).  Assessment and Plan :     Discharge Instructions       1. Rib contusion, right, initial encounter (Primary) - DG Ribs Unilateral W/ Chest Right x-ray performed in UC shows no obvious fracture, no acute bony injury.  Pain most likely secondary to soft tissue contusion from  fall.  If there is any fracture noted by radiologist with final radiology read patient will be contacted and appropriate treatment provided. - diclofenac  (VOLTAREN ) 50 MG EC tablet; Take 1 tablet (50 mg total) by mouth 2 (two) times daily.  Dispense: 30 tablet; Refill: 1 - baclofen  (LIORESAL ) 10 MG tablet; Take 1 tablet (10 mg total) by mouth 3 (three) times daily.  Dispense: 30 each; Refill: 0 -Continue to monitor symptoms for any change in severity if there is any escalation of current symptoms or development of new symptoms follow-up in ER for further evaluation and management.      Carlous Olivares B Melynda Krzywicki   Huntley Knoop, [place] B, [place] 09/15/23 1358

## 2023-09-15 NOTE — Discharge Instructions (Signed)
  1. Rib contusion, right, initial encounter (Primary) - DG Ribs Unilateral W/ Chest Right x-ray performed in UC shows no obvious fracture, no acute bony injury.  Pain most likely secondary to soft tissue contusion from fall.  If there is any fracture noted by radiologist with final radiology read patient will be contacted and appropriate treatment provided. - diclofenac  (VOLTAREN ) 50 MG EC tablet; Take 1 tablet (50 mg total) by mouth 2 (two) times daily.  Dispense: 30 tablet; Refill: 1 - baclofen  (LIORESAL ) 10 MG tablet; Take 1 tablet (10 mg total) by mouth 3 (three) times daily.  Dispense: 30 each; Refill: 0 -Continue to monitor symptoms for any change in severity if there is any escalation of current symptoms or development of new symptoms follow-up in ER for further evaluation and management.

## 2023-09-16 ENCOUNTER — Ambulatory Visit: Payer: Self-pay

## 2023-09-28 ENCOUNTER — Ambulatory Visit: Payer: Self-pay | Admitting: Family Medicine

## 2023-09-28 NOTE — Telephone Encounter (Signed)
 Appt scheduled

## 2023-09-28 NOTE — Telephone Encounter (Signed)
 FYI Only or Action Required?: FYI only for provider.  Patient was last seen in primary care on 11/06/2022 by Ricky Mabel Mt, DO.  Called Nurse Triage reporting Shoulder Pain.  Symptoms began about a month ago.  Interventions attempted: Prescription medications: steroid shot.  Symptoms are: unchanged.  Triage Disposition: See PCP When Office is Open (Within 3 Days)  Patient/caregiver understands and will follow disposition?: Yes           Copied from CRM [id number]. Topic: Clinical - Red Word Triage >> Sep 28, 2023  1:12 PM Ricky Harrison wrote: Red Word that prompted transfer to Nurse Triage: tingling in arm,shoulder pain Reason for Disposition  [1] MODERATE pain (e.g., interferes with normal activities) AND [2] present > 3 days  Answer Assessment - Initial Assessment Questions ONSET: When did the pain start?     One month ago  LOCATION: Where is the pain located?     Right shoulder  PAIN: How bad is the pain? (Scale 1-10; or mild, moderate, severe)     Intermittent, at times 8-9/10 pain level per pt wife  CAUSE: What do you think is causing the shoulder pain?     Walked into a door frame  OTHER SYMPTOMS: Do you have any other symptoms? (e.g., neck pain, swelling, rash, fever, numbness, weakness)     Tingling intermittent started 1 week ago  Denies swelling  Pt saw an orthopedic specialist and was given a steroid shot x3 weeks ago  Protocols used: Shoulder Pain-A-AH

## 2023-09-29 DIAGNOSIS — K080 Exfoliation of teeth due to systemic causes: Secondary | ICD-10-CM | POA: Diagnosis not present

## 2023-09-30 ENCOUNTER — Ambulatory Visit (INDEPENDENT_AMBULATORY_CARE_PROVIDER_SITE_OTHER): Admitting: Physician Assistant

## 2023-09-30 VITALS — BP 130/75 | HR 50 | Ht 67.5 in | Wt 176.2 lb

## 2023-09-30 DIAGNOSIS — M25511 Pain in right shoulder: Secondary | ICD-10-CM | POA: Diagnosis not present

## 2023-09-30 NOTE — Progress Notes (Signed)
 Established patient visit   Patient: Ricky Harrison   DOB: Aug 23, 1955   68 y.o. Male  MRN: [id number] Visit Date: 09/30/2023  Today's healthcare provider: Manuelita Flatness, PA-C   Chief Complaint  Patient presents with   Shoulder Pain    Right shoulder, has already seen ortho and had injection about 3 weeks ago   Subjective     Discussed the use of AI scribe software for clinical note transcription with the patient, who gave verbal consent to proceed.  History of Present Illness   [name] is a 68 year old male who presents with right shoulder pain following trauma, a few weeks ago he walked into a door frame. As the pain prevented him from raising his arm, he was seen at ortho, reports normal xray and had a cortisone injection in the shoulder. Denies pain relief, but is able to move it more..  Last week, He fell from a ladder, impacting his right side. X-rays showed no rib fractures but incidentally found kidney stones. Pain medication from urgent care was ineffective.  He experiences tingling in the neck area, which does not radiate down the arm. Tylenol  Rapid Relief provides some pain relief, and he takes Voltaren  50 mg twice daily with food. He inquires about taking two tablets at once for severe pain but has not done so regularly.     Medications: Outpatient Medications Prior to Visit  Medication Sig   aspirin  EC 81 MG tablet Take 1 tablet (81 mg total) by mouth daily. Swallow whole.   atorvastatin  (LIPITOR ) 40 MG tablet Take 1 tablet (40 mg total) by mouth daily.   baclofen  (LIORESAL ) 10 MG tablet Take 1 tablet (10 mg total) by mouth 3 (three) times daily.   diclofenac  (VOLTAREN ) 50 MG EC tablet Take 1 tablet (50 mg total) by mouth 2 (two) times daily.   ezetimibe  (ZETIA ) 10 MG tablet Take 1 tablet (10 mg total) by mouth daily.   hydrochlorothiazide  (MICROZIDE ) 12.5 MG capsule Take 1 capsule (12.5 mg total) by mouth daily.   icosapent  Ethyl (VASCEPA ) 1 g capsule  Take 2 capsules (2 g total) by mouth 2 (two) times daily.   losartan  (COZAAR ) 50 MG tablet Take 1 tablet (50 mg total) by mouth daily.   metoprolol  tartrate (LOPRESSOR ) 25 MG tablet Take 1/2 (one-half) tablet by mouth twice daily   Multiple Vitamin (MULTIVITAMIN WITH MINERALS) TABS Take 1 tablet by mouth daily. Takes on Mondays.   nitroGLYCERIN  (NITROSTAT ) 0.4 MG SL tablet Place 1 tablet (0.4 mg total) under the tongue every 5 (five) minutes as needed for chest pain.   [DISCONTINUED] azithromycin  (ZITHROMAX ) 250 MG tablet Take 2 tabs the first day and then 1 tab daily until you run out. (Patient not taking: Reported on 09/15/2023)   [DISCONTINUED] benzonatate  (TESSALON ) 200 MG capsule Take 1 capsule (200 mg total) by mouth 2 (two) times daily as needed for cough. (Patient not taking: Reported on 06/01/2023)   No facility-administered medications prior to visit.    Review of Systems  Constitutional:  Negative for fatigue and fever.  Respiratory:  Negative for cough and shortness of breath.   Cardiovascular:  Negative for chest pain, palpitations and leg swelling.  Musculoskeletal:  Positive for arthralgias.  Neurological:  Negative for dizziness and headaches.       Objective    BP 130/75   Pulse (!) 50   Ht 5' 7.5 (1.715 m)   Wt 176 lb 3.2 oz (79.9  kg)   BMI 27.19 kg/m    Physical Exam Vitals reviewed.  Constitutional:      Appearance: He is not ill-appearing.  HENT:     Head: Normocephalic.  Eyes:     Conjunctiva/sclera: Conjunctivae normal.  Cardiovascular:     Rate and Rhythm: Normal rate.  Pulmonary:     Effort: Pulmonary effort is normal. No respiratory distress.  Musculoskeletal:     Comments: Fulll ROM RUE with some pain, no crepitus, no tenderness  Neurological:     Mental Status: He is alert and oriented to person, place, and time.  Psychiatric:        Mood and Affect: Mood normal.        Behavior: Behavior normal.      No results found for any visits on  09/30/23.  Assessment & Plan    Acute pain of right shoulder  - Initiate home physical therapy exercises as previously instructed for left shoulder . Offered referral to PT but pt recalls previous exercises. - Consider MRI if no improvement in pain or function after several weeks of physical therapy. - Re-evaluate if symptoms persist or worsen.  - Continue Voltaren  50 mg twice daily with food. - Allow occasional doubling of Voltaren  dose on particularly painful days, ensuring it is taken with food. - Use Tylenol  Rapid Relief as needed for additional pain control.  Return if symptoms worsen or fail to improve.       Manuelita Flatness, PA-C  [facility] at [facility] [phone] (phone) [phone] (fax)  [facility]

## 2023-10-07 ENCOUNTER — Other Ambulatory Visit: Payer: Self-pay | Admitting: Family Medicine

## 2023-10-07 DIAGNOSIS — K080 Exfoliation of teeth due to systemic causes: Secondary | ICD-10-CM | POA: Diagnosis not present

## 2023-11-02 ENCOUNTER — Other Ambulatory Visit: Payer: Self-pay | Admitting: Family Medicine

## 2023-11-03 ENCOUNTER — Other Ambulatory Visit: Payer: Self-pay | Admitting: Family Medicine

## 2023-11-15 ENCOUNTER — Encounter: Payer: Self-pay | Admitting: Pharmacist

## 2023-11-15 NOTE — Progress Notes (Signed)
 Pharmacy Quality Measure Review  This patient is appearing on a report for being at risk of failing the adherence measure for hypertension (ACEi/ARB) medications this calendar year.   Medication: losartan  50mg  Last fill date: 07/12/2023 for 90 day supply  Reviewed recent refill history in Dr Annemarie database. He has not filled losartan  or atorvastatin  since June 2025. It looks like he should have refills remaining but Rx was sent to [facility] in [place]. Spoke with patient and he does need losartan  refilled - Coordinated with [facility] to transfer Rx from [place] to [facility].    He also should be due to refill atorvastatin  40mg  however dose was decreaed from 80mg  to 40mg  earlier in 2025 and patient was halving the 80mg  tablets. He reports he currently has about 100 tablets of atorvastatin  80mg  or 40mg  on hand.    Madelin Ray, PharmD Clinical Pharmacist [facility]  Population Health [phone]

## 2023-11-24 ENCOUNTER — Other Ambulatory Visit: Payer: Self-pay | Admitting: Family Medicine

## 2023-11-24 ENCOUNTER — Ambulatory Visit

## 2023-11-25 ENCOUNTER — Ambulatory Visit: Payer: Self-pay | Admitting: Family Medicine

## 2023-11-25 ENCOUNTER — Ambulatory Visit (INDEPENDENT_AMBULATORY_CARE_PROVIDER_SITE_OTHER): Admitting: Family Medicine

## 2023-11-25 VITALS — BP 122/78 | HR 65 | Temp 98.0°F | Resp 16 | Ht 67.0 in | Wt 175.0 lb

## 2023-11-25 DIAGNOSIS — R0981 Nasal congestion: Secondary | ICD-10-CM

## 2023-11-25 DIAGNOSIS — Z0000 Encounter for general adult medical examination without abnormal findings: Secondary | ICD-10-CM

## 2023-11-25 DIAGNOSIS — I10 Essential (primary) hypertension: Secondary | ICD-10-CM | POA: Diagnosis not present

## 2023-11-25 DIAGNOSIS — Z1211 Encounter for screening for malignant neoplasm of colon: Secondary | ICD-10-CM

## 2023-11-25 LAB — COMPREHENSIVE METABOLIC PANEL WITH GFR
ALT: 45 U/L (ref 0–53)
AST: 27 U/L (ref 0–37)
Albumin: 4.6 g/dL (ref 3.5–5.2)
Alkaline Phosphatase: 58 U/L (ref 39–117)
BUN: 17 mg/dL (ref 6–23)
CO2: 30 meq/L (ref 19–32)
Calcium: 9.8 mg/dL (ref 8.4–10.5)
Chloride: 103 meq/L (ref 96–112)
Creatinine, Ser: 1.16 mg/dL (ref 0.40–1.50)
GFR: 65.64 mL/min (ref >60.00)
Glucose, Bld: 104 mg/dL — ABNORMAL HIGH (ref 70–99)
Potassium: 4.7 meq/L (ref 3.5–5.1)
Sodium: 142 meq/L (ref 135–145)
Total Bilirubin: 0.8 mg/dL (ref 0.2–1.2)
Total Protein: 6.6 g/dL (ref 6.0–8.3)

## 2023-11-25 LAB — LIPID PANEL
Cholesterol: 89 mg/dL (ref 0–200)
HDL: 39.6 mg/dL (ref >39.00)
LDL Cholesterol: 14 mg/dL (ref 0–99)
NonHDL: 49.63
Total CHOL/HDL Ratio: 2
Triglycerides: 180 mg/dL — ABNORMAL HIGH (ref 0.0–149.0)
VLDL: 36 mg/dL (ref 0.0–40.0)

## 2023-11-25 LAB — CBC
HCT: 43.6 % (ref 39.0–52.0)
Hemoglobin: 15 g/dL (ref 13.0–17.0)
MCHC: 34.5 g/dL (ref 30.0–36.0)
MCV: 89.6 fl (ref 78.0–100.0)
Platelets: 212 K/uL (ref 150.0–400.0)
RBC: 4.87 Mil/uL (ref 4.22–5.81)
RDW: 12.6 % (ref 11.5–15.5)
WBC: 6.7 K/uL (ref 4.0–10.5)

## 2023-11-25 NOTE — Progress Notes (Signed)
 Chief Complaint  Patient presents with   Annual Exam    CPE    [name] is here for a complete physical.   His last physical was >1 year ago.  Current diet: in general, a healthy diet.   Current exercise: strength training, walking, running, elliptical, rowing Weight trend: stable Fatigue out of ordinary? No. Seat belt? Yes.   Advanced directive? Yes  Health maintenance Shingrix- Yes Colonoscopy- Due Tetanus- Due Hep C- Yes Pneumonia vaccine- Yes  Past Medical History:  Diagnosis Date   Basal cell carcinoma of face 2006   History of kidney stones    Hypertension    Inguinal hernia, right    Dr. Gail; still flares up sometimes (08/19/2016)   Pneumonia ~ 1979   walking   Sleep apnea    had mask; couldn't tolerate it; I usually use nasal strips now (08/19/2016)     Past Surgical History:  Procedure Laterality Date   CORONARY STENT INTERVENTION N/A 08/20/2016   Procedure: Coronary Stent Intervention;  Surgeon: Dann Candyce RAMAN, MD;  Location: [facility] INVASIVE CV LAB;  Service: Cardiovascular;  Laterality: N/A;   CYSTOSCOPY W/ STONE MANIPULATION  X 2   INGUINAL HERNIA REPAIR  01/01/2012   Procedure: LAPAROSCOPIC INGUINAL HERNIA;  Surgeon: Elon CHRISTELLA Gail, MD;  Location: [facility] ORS;  Service: General;  Laterality: Left;   INGUINAL HERNIA REPAIR Right 2016   Dr. Gail FONTANA HERNIA REPAIR Left    INSERTION OF MESH  01/01/2012   Procedure: INSERTION OF MESH;  Surgeon: Elon CHRISTELLA Gail, MD;  Location: [facility];  Service: General;  Laterality: Left;   LEFT HEART CATH AND CORONARY ANGIOGRAPHY N/A 08/20/2016   Procedure: Left Heart Cath and Coronary Angiography;  Surgeon: Dann Candyce RAMAN, MD;  Location: [facility] INVASIVE CV LAB;  Service: Cardiovascular;  Laterality: N/A;   LITHOTRIPSY  2008   5 times total - Dr. Alline   LITHOTRIPSY  X ~ 5   MOHS SURGERY  2006   basal cell; face   NASAL SEPTUM SURGERY  2004   Dr. Mable    Medications  Current  Outpatient Medications on File Prior to Visit  Medication Sig Dispense Refill   metoprolol  tartrate (LOPRESSOR ) 25 MG tablet Take 0.5 tablets (12.5 mg total) by mouth 2 (two) times daily. 30 tablet 0   aspirin  EC 81 MG tablet Take 1 tablet (81 mg total) by mouth daily. Swallow whole. 90 tablet 3   atorvastatin  (LIPITOR ) 40 MG tablet Take 1 tablet (40 mg total) by mouth daily. 90 tablet 3   baclofen  (LIORESAL ) 10 MG tablet Take 1 tablet (10 mg total) by mouth 3 (three) times daily. 30 each 0   diclofenac  (VOLTAREN ) 50 MG EC tablet Take 1 tablet (50 mg total) by mouth 2 (two) times daily. 30 tablet 1   ezetimibe  (ZETIA ) 10 MG tablet Take 1 tablet (10 mg total) by mouth daily. 90 tablet 3   hydrochlorothiazide  (MICROZIDE ) 12.5 MG capsule Take 1 capsule (12.5 mg total) by mouth daily. 90 capsule 0   icosapent  Ethyl (VASCEPA ) 1 g capsule Take 2 capsules (2 g total) by mouth 2 (two) times daily. 120 capsule 11   losartan  (COZAAR ) 50 MG tablet Take 1 tablet (50 mg total) by mouth daily. 90 tablet 3   Multiple Vitamin (MULTIVITAMIN WITH MINERALS) TABS Take 1 tablet by mouth daily. Takes on Mondays.     nitroGLYCERIN  (NITROSTAT ) 0.4 MG SL tablet Place 1 tablet (0.4 mg total)  under the tongue every 5 (five) minutes as needed for chest pain. 25 tablet 2   Allergies Allergies  Allergen Reactions   Sulfonamide Derivatives Rash    At injection site. Made the entire vein that was injected red and spread up the arm.    Family History Family History  Problem Relation Age of Onset   Cancer Mother        throat   Heart attack Father 93    Review of Systems: Constitutional:  no fevers Eye:  no recent significant change in vision Ears:  No changes in hearing Nose/Mouth/Throat:  +R sided nasal congestion, no sore throat Cardiovascular: no chest pain Respiratory:  No shortness of breath Gastrointestinal:  No change in bowel habits GU:  No frequency Integumentary:  no abnormal skin lesions  reported Neurologic:  no headaches Endocrine:  denies unexplained weight changes  Exam BP 122/78 (BP Location: Left Arm, Patient Position: Sitting)   Pulse 65   Temp 98 F (36.7 C) (Oral)   Resp 16   Ht 5' 7 (1.702 m)   Wt 175 lb (79.4 kg)   SpO2 96%   BMI 27.41 kg/m  General:  well developed, well nourished, in no apparent distress Skin:  no significant moles, warts, or growths Head:  no masses, lesions, or tenderness Eyes:  pupils equal and round, sclera anicteric without injection Ears:  canals without lesions, TMs shiny without retraction, no obvious effusion, no erythema Nose:  nares patent, mucosa normal Throat/Pharynx:  lips and gingiva without lesion; tongue and uvula midline; non-inflamed pharynx; no exudates or postnasal drainage Lungs:  clear to auscultation, breath sounds equal bilaterally, no respiratory distress Cardio:  regular rate and rhythm, no LE edema or bruits Rectal: Deferred GI: BS+, S, NT, ND, no masses or organomegaly Musculoskeletal:  symmetrical muscle groups noted without atrophy or deformity Neuro:  gait normal; deep tendon reflexes normal and symmetric Psych: well oriented with normal range of affect and appropriate judgment/insight  Assessment and Plan  Well adult exam  Essential hypertension - Plan: CBC, Comprehensive metabolic panel with GFR, Lipid panel  Nasal congestion - Plan: Ambulatory referral to ENT  Screen for colon cancer - Plan: Ambulatory referral to Gastroenterology   Well 68 y.o. male. Counseled on diet and exercise. Advanced directive form provided today.  Hx of chronic nasal congestion. Had a procedure by Dr. Mable 21 yrs ago. Will refer back to ENT as he was told it would probably take several procedures to open things up.  Refer GI for CCS.  Other orders as above. Follow up in 1 yr.  The patient voiced understanding and agreement to the plan.  Mabel Mt [place], DO 11/25/23 7:49 AM

## 2023-11-25 NOTE — Patient Instructions (Addendum)
 Give us  2-3 business days to get the results of your labs back.   Keep the diet clean and stay active.  Please get me a copy of your advanced directive form at your convenience.   Please get your tetanus booster and COVID booster at the pharmacy.  If you do not hear anything about your referral in the next 1-2 weeks, call our office and ask for an update.  Let us  know if you need anything.

## 2023-12-01 ENCOUNTER — Other Ambulatory Visit: Payer: Self-pay

## 2023-12-03 DIAGNOSIS — J3489 Other specified disorders of nose and nasal sinuses: Secondary | ICD-10-CM | POA: Diagnosis not present

## 2023-12-03 DIAGNOSIS — R0981 Nasal congestion: Secondary | ICD-10-CM | POA: Diagnosis not present

## 2023-12-03 NOTE — Progress Notes (Signed)
 Ricky Harrison                                          MRN: [id number]   12/03/2023   The [facility] Quality Team Specialist reviewed this patient medical record for the purposes of chart review for care gap closure. The following were reviewed: chart review for care gap closure-colorectal cancer screening.    [facility]BCI Quality Team

## 2023-12-06 DIAGNOSIS — L821 Other seborrheic keratosis: Secondary | ICD-10-CM | POA: Diagnosis not present

## 2023-12-06 DIAGNOSIS — L570 Actinic keratosis: Secondary | ICD-10-CM | POA: Diagnosis not present

## 2023-12-06 DIAGNOSIS — Z85828 Personal history of other malignant neoplasm of skin: Secondary | ICD-10-CM | POA: Diagnosis not present

## 2023-12-06 DIAGNOSIS — Z08 Encounter for follow-up examination after completed treatment for malignant neoplasm: Secondary | ICD-10-CM | POA: Diagnosis not present

## 2023-12-06 DIAGNOSIS — D239 Other benign neoplasm of skin, unspecified: Secondary | ICD-10-CM | POA: Diagnosis not present

## 2023-12-16 ENCOUNTER — Ambulatory Visit (INDEPENDENT_AMBULATORY_CARE_PROVIDER_SITE_OTHER): Admitting: *Deleted

## 2023-12-16 VITALS — Ht 67.0 in | Wt 174.0 lb

## 2023-12-16 DIAGNOSIS — Z0000 Encounter for general adult medical examination without abnormal findings: Secondary | ICD-10-CM

## 2023-12-16 NOTE — Progress Notes (Signed)
 Please attest this visit in the absence of patient primary care provider.    Subjective:   Ricky Harrison is a 68 y.o. male who presents for a Medicare Annual Wellness Visit.  Allergies (verified) Sulfonamide derivatives   History: Past Medical History:  Diagnosis Date   Basal cell carcinoma of face 2006   History of kidney stones    Hypertension    Inguinal hernia, right    Dr. Gail; still flares up sometimes (08/19/2016)   Pneumonia ~ 1979   walking   Sleep apnea    had mask; couldn't tolerate it; I usually use nasal strips now (08/19/2016)   Past Surgical History:  Procedure Laterality Date   CORONARY STENT INTERVENTION N/A 08/20/2016   Procedure: Coronary Stent Intervention;  Surgeon: Dann Candyce RAMAN, MD;  Location: [facility] INVASIVE CV LAB;  Service: Cardiovascular;  Laterality: N/A;   CYSTOSCOPY W/ STONE MANIPULATION  X 2   INGUINAL HERNIA REPAIR  01/01/2012   Procedure: LAPAROSCOPIC INGUINAL HERNIA;  Surgeon: Elon CHRISTELLA Gail, MD;  Location: [facility];  Service: General;  Laterality: Left;   INGUINAL HERNIA REPAIR Right 2016   Dr. Gail FONTANA HERNIA REPAIR Left    INSERTION OF MESH  01/01/2012   Procedure: INSERTION OF MESH;  Surgeon: Elon CHRISTELLA Gail, MD;  Location: [facility];  Service: General;  Laterality: Left;   LEFT HEART CATH AND CORONARY ANGIOGRAPHY N/A 08/20/2016   Procedure: Left Heart Cath and Coronary Angiography;  Surgeon: Dann Candyce RAMAN, MD;  Location: [facility] INVASIVE CV LAB;  Service: Cardiovascular;  Laterality: N/A;   LITHOTRIPSY  2008   5 times total - Dr. Alline   LITHOTRIPSY  X ~ 5   MOHS SURGERY  2006   basal cell; face   NASAL SEPTUM SURGERY  2004   Dr. Mable   Family History  Problem Relation Age of Onset   Cancer Mother        throat   Heart attack Father 60   Social History   Occupational History   Not on file  Tobacco Use   Smoking status: Never   Smokeless tobacco: Never  Vaping Use   Vaping status: Never Used   Substance and Sexual Activity   Alcohol use: Yes    Alcohol/week: 5.0 - 8.0 standard drinks of alcohol    Types: 5 - 8 Shots of liquor per week   Drug use: No   Sexual activity: Yes   Tobacco Counseling Counseling given: Not Answered  SDOH Screenings   Food Insecurity: No Food Insecurity (11/25/2023)  Housing: Low Risk  (11/25/2023)  Transportation Needs: No Transportation Needs (11/25/2023)  Utilities: Not At Risk (12/16/2023)  Alcohol Screen: Low Risk  (11/25/2023)  Depression (PHQ2-9): Low Risk  (12/16/2023)  Recent Concern: Depression (PHQ2-9) - Medium Risk (09/30/2023)  Financial Resource Strain: Low Risk  (11/25/2023)  Physical Activity: Sufficiently Active (12/16/2023)  Recent Concern: Physical Activity - Insufficiently Active (11/25/2023)  Social Connections: Socially Integrated (12/16/2023)  Stress: No Stress Concern Present (12/16/2023)  Recent Concern: Stress - Stress Concern Present (09/30/2023)  Tobacco Use: Low Risk  (12/16/2023)  Health Literacy: Adequate Health Literacy (12/16/2023)   Depression Screen    12/16/2023    8:34 AM 11/25/2023    7:28 AM 09/30/2023    8:10 AM 10/13/2022   10:22 AM 06/02/2021    7:41 AM 10/05/2016   10:40 AM 06/08/2016    8:47 AM  PHQ 2/9 Scores  PHQ - 2 Score 0 0 1  0 0 0 0  PHQ- 9 Score 0 0 5 0   0     Goals Addressed               This Visit's Progress     to lose 5 pounds, currently at 168 dry weight. (pt-stated)         Visit info / Clinical Intake: Medicare Wellness Visit Type:: Subsequent Annual Wellness Visit Medicare Wellness Visit Mode:: Telephone If telephone:: video declined If telephone or video:: pt reported vitals Interpreter Needed?: No Pre-visit prep was completed: yes AWV questionnaire completed by patient prior to visit?: yes Date:: 12/15/23 Living arrangements:: lives with spouse/significant other Patient's Overall Health Status Rating: very good Typical amount of pain: none Does pain affect daily life?:  no Are you currently prescribed opioids?: no  Dietary Habits and Nutritional Risks How many meals a day?: 2 Eats fruit and vegetables daily?: yes Most meals are obtained by: preparing own meals; eating out Diabetic:: no  Functional Status Activities of Daily Living (to include ambulation/medication): (Patient-Rptd) Independent Ambulation: (Patient-Rptd) Independent Medication Administration: Independent Home Management: (Patient-Rptd) Independent Manage your own finances?: yes Primary transportation is: driving Concerns about vision?: no *vision screening is required for WT[facility]* (Dr Francella / [facility].) Concerns about hearing?: (!) yes (wears hearing aids and no change from previous test) Uses hearing aids?: (!) yes  Fall Screening Falls in the past year?: 1 Number of falls in past year: (Patient-Rptd) 0 Was there an injury with Fall?: 1 Fall Risk Category Calculator: 2 Patient Fall Risk Level: Moderate Fall Risk  Fall Risk Patient at Risk for Falls Due to: No Fall Risks Fall risk Follow up: Falls evaluation completed  Home and Transportation Safety: All rugs have non-skid backing?: yes All stairs or steps have railings?: N/A, no stairs Grab bars in the bathtub or shower?: yes Have non-skid surface in bathtub or shower?: yes Good home lighting?: yes Regular seat belt use?: yes Hospital stays in the last year:: no  Cognitive Assessment Difficulty concentrating, remembering, or making decisions? : no Will 6CIT or Mini Cog be Completed: yes What year is it?: 0 points What month is it?: 0 points Give patient an address phrase to remember (5 components): [address], [place]  About what time is it?: 0 points Count backwards from 20 to 1: 0 points Say the months of the year in reverse: 0 points Repeat the address phrase from earlier: 0 points 6 CIT Score: 0 points  Advance Directives (For Healthcare) Does Patient Have a Medical Advance Directive?: Yes Does  patient want to make changes to medical advance directive?: No - Guardian declined Type of Advance Directive: Healthcare Power of [place]; Living will Copy of Healthcare Power of Attorney in Chart?: No - copy requested Copy of Living Will in Chart?: No - copy requested  Reviewed/Updated  Reviewed/Updated: All        Objective:    Today's Vitals   12/16/23 0819  Weight: 174 lb (78.9 kg)  Height: 5' 7 (1.702 m)   Body mass index is 27.25 kg/m.  Current Medications (verified) Outpatient Encounter Medications as of 12/16/2023  Medication Sig   aspirin  EC 81 MG tablet Take 1 tablet (81 mg total) by mouth daily. Swallow whole.   atorvastatin  (LIPITOR ) 40 MG tablet Take 1 tablet (40 mg total) by mouth daily.   baclofen  (LIORESAL ) 10 MG tablet Take 1 tablet (10 mg total) by mouth 3 (three) times daily.   diclofenac  (VOLTAREN ) 50 MG EC  tablet Take 1 tablet (50 mg total) by mouth 2 (two) times daily.   ezetimibe  (ZETIA ) 10 MG tablet Take 1 tablet (10 mg total) by mouth daily.   hydrochlorothiazide  (MICROZIDE ) 12.5 MG capsule Take 1 capsule (12.5 mg total) by mouth daily.   icosapent  Ethyl (VASCEPA ) 1 g capsule Take 2 capsules (2 g total) by mouth 2 (two) times daily.   losartan  (COZAAR ) 50 MG tablet Take 1 tablet (50 mg total) by mouth daily.   metoprolol  tartrate (LOPRESSOR ) 25 MG tablet Take 0.5 tablets (12.5 mg total) by mouth 2 (two) times daily.   Multiple Vitamin (MULTIVITAMIN WITH MINERALS) TABS Take 1 tablet by mouth daily. Takes on Mondays.   nitroGLYCERIN  (NITROSTAT ) 0.4 MG SL tablet Place 1 tablet (0.4 mg total) under the tongue every 5 (five) minutes as needed for chest pain.   triamcinolone (NASACORT) 55 MCG/ACT AERO nasal inhaler Place 2 sprays into the nose at bedtime. Each nostril   No facility-administered encounter medications on file as of 12/16/2023.   Hearing/Vision screen No results found. Immunizations and Health Maintenance Health Maintenance  Topic Date Due    Influenza Vaccine  05/09/2024 (Originally 09/10/2023)   COLON CANCER SCREENING ANNUAL FOBT  05/25/2024 (Originally 10/14/2023)   COVID-19 Vaccine (7 - 2025-26 season) 05/25/2024 (Originally 10/11/2023)   Colonoscopy  11/24/2024 (Originally 05/29/2002)   Medicare Annual Wellness (AWV)  12/15/2024   DTaP/Tdap/Td (3 - Tdap) 03/11/2033   Pneumococcal Vaccine: 50+ Years  Completed   Hepatitis C Screening  Completed   Zoster Vaccines- Shingrix  Completed   Meningococcal B Vaccine  Aged Out        Assessment/Plan:  This is a routine wellness examination for [facility].  Patient Care Team: Frann Mabel Mt, DO as PCP - General (Family Medicine) Pietro Redell RAMAN, MD as PCP - Cardiology (Cardiology) Gail Favorite, MD as Consulting Physician (General Surgery) Francella Fairy SAILOR, OD (Optometry)  I have personally reviewed and noted the following in the patient's chart:   Medical and social history Use of alcohol, tobacco or illicit drugs  Current medications and supplements including opioid prescriptions. Functional ability and status Nutritional status Physical activity Advanced directives List of other physicians Hospitalizations, surgeries, and ER visits in previous 12 months Vitals Screenings to include cognitive, depression, and falls Referrals and appointments  No orders of the defined types were placed in this encounter.  In addition, I have reviewed and discussed with patient certain preventive protocols, quality metrics, and best practice recommendations. A written personalized care plan for preventive services as well as general preventive health recommendations were provided to patient.   Lolita Libra, CMA   12/16/2023   Return in 1 year (on 12/15/2024).  After Visit Summary: (MyChart) Due to this being a telephonic visit, the after visit summary with patients personalized plan was offered to patient via MyChart   Nurse Notes: nothing significant to report

## 2023-12-16 NOTE — Addendum Note (Signed)
 Addended by: MAREE HUM A on: 12/16/2023 11:59 AM   Modules accepted: Orders, Level of Service

## 2023-12-16 NOTE — Patient Instructions (Addendum)
 Ricky Harrison,  Thank you for taking the time for your Medicare Wellness Visit. I appreciate your continued commitment to your health goals. Please review the care plan we discussed, and feel free to reach out if I can assist you further.  Please note that Annual Wellness Visits do not include a physical exam. Some assessments may be limited, especially if the visit was conducted virtually. If needed, we may recommend an in-person follow-up with your provider.  Goal:  To lose 5 pounds.  Keep up the good work with your lifestyle changes (diet and exercise)!  Ongoing Care Seeing your primary care provider every 3 to 6 months helps us  monitor your health and provide consistent, personalized care.   Lab visit:  12/30/24 8:15am, fasting Dr Frann: 11/27/24 8:45am, fasting physical  Annual Wellness Visit: 12/18/24 8:20am, telephone  Referrals If a referral was made during today's visit and you haven't received any updates within two weeks, please contact the referred provider directly to check on the status.  Colonoscopy: [facility]  [address] [place] [address] [phone]  Recommended Screenings:  Health Maintenance  Topic Date Due   Medicare Annual Wellness Visit  Never done   Flu Shot  05/09/2024*   Stool Blood Test  05/25/2024*   COVID-19 Vaccine (7 - 2025-26 season) 05/25/2024*   Colon Cancer Screening  11/24/2024*   DTaP/Tdap/Td vaccine (3 - Tdap) 03/11/2033   Pneumococcal Vaccine for age over 35  Completed   Hepatitis C Screening  Completed   Zoster (Shingles) Vaccine  Completed   Meningitis B Vaccine  Aged Out  *Topic was postponed. The date shown is not the original due date.       12/15/2023    9:01 PM  Advanced Directives  Does Patient Have a Medical Advance Directive? Yes  Type of Estate Agent of [place];Living will  Does patient want to make changes to medical advance directive? No - Guardian declined  Copy of  Healthcare Power of Attorney in Chart? No - copy requested   Bring a copy of your health care power of attorney and living will to the office to be added to your chart at your convenience. You can mail a copy to [facility] [address] [place], [place] [address] or email to [email or web address]   Vision: Annual vision screenings are recommended for early detection of glaucoma, cataracts, and diabetic retinopathy. These exams can also reveal signs of chronic conditions such as diabetes and high blood pressure.  Dental: Annual dental screenings help detect early signs of oral cancer, gum disease, and other conditions linked to overall health, including heart disease and diabetes.  Please see the attached documents for additional preventive care recommendations.

## 2023-12-17 NOTE — Progress Notes (Signed)
 Please attest this visit in the absence of patient primary care provider.    I connected with  Ricky Harrison on 12/17/23 by a audio enabled telemedicine application and verified that I am speaking with the correct person using two identifiers.  Patient Location: Home  Provider Location: Office/Clinic  Persons Participating in Visit: Patient.  I discussed the limitations of evaluation and management by telemedicine. The patient expressed understanding and agreed to proceed.  Vital Signs: Because this visit was a virtual/telehealth visit, some criteria may be missing or patient reported. Any vitals not documented were not able to be obtained and vitals that have been documented are patient reported.  Subjective:   Ricky Harrison is a 68 y.o. male who presents for a Medicare Annual Wellness Visit.  Allergies (verified) Sulfonamide derivatives   History: Past Medical History:  Diagnosis Date   Basal cell carcinoma of face 2006   History of kidney stones    Hypertension    Inguinal hernia, right    Dr. Gail; still flares up sometimes (08/19/2016)   Pneumonia ~ 1979   walking   Sleep apnea    had mask; couldn't tolerate it; I usually use nasal strips now (08/19/2016)   Past Surgical History:  Procedure Laterality Date   CORONARY STENT INTERVENTION N/A 08/20/2016   Procedure: Coronary Stent Intervention;  Surgeon: Dann Candyce RAMAN, MD;  Location: [facility] INVASIVE CV LAB;  Service: Cardiovascular;  Laterality: N/A;   CYSTOSCOPY W/ STONE MANIPULATION  X 2   INGUINAL HERNIA REPAIR  01/01/2012   Procedure: LAPAROSCOPIC INGUINAL HERNIA;  Surgeon: Elon HERO Gail, MD;  Location: [facility];  Service: General;  Laterality: Left;   INGUINAL HERNIA REPAIR Right 2016   Dr. Gail FONTANA HERNIA REPAIR Left    INSERTION OF MESH  01/01/2012   Procedure: INSERTION OF MESH;  Surgeon: Elon HERO Gail, MD;  Location: [facility];  Service: General;  Laterality: Left;   LEFT HEART CATH AND  CORONARY ANGIOGRAPHY N/A 08/20/2016   Procedure: Left Heart Cath and Coronary Angiography;  Surgeon: Dann Candyce RAMAN, MD;  Location: [facility] INVASIVE CV LAB;  Service: Cardiovascular;  Laterality: N/A;   LITHOTRIPSY  2008   5 times total - Dr. Alline   LITHOTRIPSY  X ~ 5   MOHS SURGERY  2006   basal cell; face   NASAL SEPTUM SURGERY  2004   Dr. Mable   Family History  Problem Relation Age of Onset   Cancer Mother        throat   Heart attack Father 68   Social History   Occupational History   Not on file  Tobacco Use   Smoking status: Never   Smokeless tobacco: Never  Vaping Use   Vaping status: Never Used  Substance and Sexual Activity   Alcohol use: Yes    Alcohol/week: 5.0 - 8.0 standard drinks of alcohol    Types: 5 - 8 Shots of liquor per week   Drug use: No   Sexual activity: Yes   Tobacco Counseling Counseling given: Not Answered  SDOH Screenings   Food Insecurity: No Food Insecurity (11/25/2023)  Housing: Low Risk  (11/25/2023)  Transportation Needs: No Transportation Needs (11/25/2023)  Utilities: Not At Risk (12/16/2023)  Alcohol Screen: Low Risk  (11/25/2023)  Depression (PHQ2-9): Low Risk  (12/16/2023)  Recent Concern: Depression (PHQ2-9) - Medium Risk (09/30/2023)  Financial Resource Strain: Low Risk  (11/25/2023)  Physical Activity: Sufficiently Active (12/16/2023)  Recent Concern: Physical Activity -  Insufficiently Active (11/25/2023)  Social Connections: Socially Integrated (12/16/2023)  Stress: No Stress Concern Present (12/16/2023)  Recent Concern: Stress - Stress Concern Present (09/30/2023)  Tobacco Use: Low Risk  (12/16/2023)  Health Literacy: Adequate Health Literacy (12/16/2023)   Depression Screen    12/16/2023    8:34 AM 11/25/2023    7:28 AM 09/30/2023    8:10 AM 10/13/2022   10:22 AM 06/02/2021    7:41 AM 10/05/2016   10:40 AM 06/08/2016    8:47 AM  PHQ 2/9 Scores  PHQ - 2 Score 0 0 1 0 0 0 0  PHQ- 9 Score 0  0  5  0    0      Data  saved with a previous flowsheet row definition     Goals Addressed               This Visit's Progress     to lose 5 pounds, currently at 168 dry weight. (pt-stated)         Visit info / Clinical Intake: Medicare Wellness Visit Type:: Initial Annual Wellness Visit Medicare Wellness Visit Mode:: Telephone If telephone:: video declined If telephone or video:: pt reported vitals Interpreter Needed?: No Pre-visit prep was completed: yes AWV questionnaire completed by patient prior to visit?: yes Date:: 12/15/23 Living arrangements:: lives with spouse/significant other Patient's Overall Health Status Rating: very good Typical amount of pain: none Does pain affect daily life?: no Are you currently prescribed opioids?: no  Dietary Habits and Nutritional Risks How many meals a day?: 2 Eats fruit and vegetables daily?: yes Most meals are obtained by: preparing own meals; eating out Diabetic:: no  Functional Status Activities of Daily Living (to include ambulation/medication): (Patient-Rptd) Independent Ambulation: (Patient-Rptd) Independent Medication Administration: Independent Home Management: (Patient-Rptd) Independent Manage your own finances?: yes Primary transportation is: driving Concerns about vision?: no *vision screening is required for [facility]* (Dr Francella / [facility].) Concerns about hearing?: (!) yes (wears hearing aids and no change from previous test) Uses hearing aids?: (!) yes  Fall Screening Falls in the past year?: 1 Number of falls in past year: (Patient-Rptd) 0 Was there an injury with Fall?: 1 Fall Risk Category Calculator: 2 Patient Fall Risk Level: Moderate Fall Risk  Fall Risk Patient at Risk for Falls Due to: No Fall Risks Fall risk Follow up: Falls evaluation completed  Home and Transportation Safety: All rugs have non-skid backing?: yes All stairs or steps have railings?: N/A, no stairs Grab bars in the bathtub or shower?: yes Have  non-skid surface in bathtub or shower?: yes Good home lighting?: yes Regular seat belt use?: yes Hospital stays in the last year:: no  Cognitive Assessment Difficulty concentrating, remembering, or making decisions? : no Will 6CIT or Mini Cog be Completed: yes What year is it?: 0 points What month is it?: 0 points Give patient an address phrase to remember (5 components): [address] [place]  About what time is it?: 0 points Count backwards from 20 to 1: 0 points Say the months of the year in reverse: 0 points Repeat the address phrase from earlier: 0 points 6 CIT Score: 0 points  Advance Directives (For Healthcare) Does Patient Have a Medical Advance Directive?: Yes Does patient want to make changes to medical advance directive?: No - Guardian declined Type of Advance Directive: Healthcare Power of [place]; Living will Copy of Healthcare Power of Attorney in Chart?: No - copy requested Copy of Living Will in Chart?: No - copy requested  Reviewed/Updated  Reviewed/Updated: All        Objective:    Today's Vitals   12/16/23 0819  Weight: 174 lb (78.9 kg)  Height: 5' 7 (1.702 m)   Body mass index is 27.25 kg/m.  Current Medications (verified) Outpatient Encounter Medications as of 12/16/2023  Medication Sig   aspirin  EC 81 MG tablet Take 1 tablet (81 mg total) by mouth daily. Swallow whole.   atorvastatin  (LIPITOR ) 40 MG tablet Take 1 tablet (40 mg total) by mouth daily.   baclofen  (LIORESAL ) 10 MG tablet Take 1 tablet (10 mg total) by mouth 3 (three) times daily.   diclofenac  (VOLTAREN ) 50 MG EC tablet Take 1 tablet (50 mg total) by mouth 2 (two) times daily.   ezetimibe  (ZETIA ) 10 MG tablet Take 1 tablet (10 mg total) by mouth daily.   hydrochlorothiazide  (MICROZIDE ) 12.5 MG capsule Take 1 capsule (12.5 mg total) by mouth daily.   icosapent  Ethyl (VASCEPA ) 1 g capsule Take 2 capsules (2 g total) by mouth 2 (two) times daily.   losartan  (COZAAR ) 50 MG tablet  Take 1 tablet (50 mg total) by mouth daily.   metoprolol  tartrate (LOPRESSOR ) 25 MG tablet Take 0.5 tablets (12.5 mg total) by mouth 2 (two) times daily.   Multiple Vitamin (MULTIVITAMIN WITH MINERALS) TABS Take 1 tablet by mouth daily. Takes on Mondays.   nitroGLYCERIN  (NITROSTAT ) 0.4 MG SL tablet Place 1 tablet (0.4 mg total) under the tongue every 5 (five) minutes as needed for chest pain.   triamcinolone (NASACORT) 55 MCG/ACT AERO nasal inhaler Place 2 sprays into the nose at bedtime. Each nostril   No facility-administered encounter medications on file as of 12/16/2023.   Hearing/Vision screen No results found. Immunizations and Health Maintenance Health Maintenance  Topic Date Due   Influenza Vaccine  05/09/2024 (Originally 09/10/2023)   COLON CANCER SCREENING ANNUAL FOBT  05/25/2024 (Originally 10/14/2023)   COVID-19 Vaccine (7 - 2025-26 season) 05/25/2024 (Originally 10/11/2023)   Colonoscopy  11/24/2024 (Originally 05/29/2002)   Medicare Annual Wellness (AWV)  12/15/2024   DTaP/Tdap/Td (3 - Tdap) 03/11/2033   Pneumococcal Vaccine: 50+ Years  Completed   Hepatitis C Screening  Completed   Zoster Vaccines- Shingrix  Completed   Meningococcal B Vaccine  Aged Out        Assessment/Plan:  This is a routine wellness examination for [facility]  Patient Care Team: Frann Mabel Mt, DO as PCP - General (Family Medicine) Pietro Redell RAMAN, MD as PCP - Cardiology (Cardiology) Gail Favorite, MD as Consulting Physician (General Surgery) Francella Fairy SAILOR, OD (Optometry)  I have personally reviewed and noted the following in the patient's chart:   Medical and social history Use of alcohol, tobacco or illicit drugs  Current medications and supplements including opioid prescriptions. Functional ability and status Nutritional status Physical activity Advanced directives List of other physicians Hospitalizations, surgeries, and ER visits in previous 12 months Vitals Screenings to  include cognitive, depression, and falls Referrals and appointments  No orders of the defined types were placed in this encounter.  In addition, I have reviewed and discussed with patient certain preventive protocols, quality metrics, and best practice recommendations. A written personalized care plan for preventive services as well as general preventive health recommendations were provided to patient.   Lolita Libra, CMA   12/17/2023   Return in 1 year (on 12/15/2024).  After Visit Summary: (MyChart) Due to this being a telephonic visit, the after visit summary with patients personalized plan was offered to patient  via MyChart   Nurse Notes: nothing significant to report

## 2023-12-21 ENCOUNTER — Telehealth: Payer: Self-pay | Admitting: Family Medicine

## 2023-12-21 DIAGNOSIS — E781 Pure hyperglyceridemia: Secondary | ICD-10-CM

## 2023-12-21 NOTE — Progress Notes (Signed)
 Please attest this visit in the absence of patient primary care provider.    I connected with  Ricky Harrison on 12/16/23 by a audio enabled telemedicine application and verified that I am speaking with the correct person using two identifiers.  Patient Location: Home  Provider Location: Office/Clinic  Persons Participating in Visit: Patient.  I discussed the limitations of evaluation and management by telemedicine. The patient expressed understanding and agreed to proceed.  Vital Signs: Because this visit was a virtual/telehealth visit, some criteria may be missing or patient reported. Any vitals not documented were not able to be obtained and vitals that have been documented are patient reported.  Subjective:   Ricky Harrison is a 68 y.o. male who presents for a Medicare Annual Wellness Visit.  Allergies (verified) Sulfonamide derivatives   History: Past Medical History:  Diagnosis Date   Basal cell carcinoma of face 2006   History of kidney stones    Hypertension    Inguinal hernia, right    Dr. Gail; still flares up sometimes (08/19/2016)   Pneumonia ~ 1979   walking   Sleep apnea    had mask; couldn't tolerate it; I usually use nasal strips now (08/19/2016)   Past Surgical History:  Procedure Laterality Date   CORONARY STENT INTERVENTION N/A 08/20/2016   Procedure: Coronary Stent Intervention;  Surgeon: Dann Candyce RAMAN, MD;  Location: [facility] INVASIVE CV LAB;  Service: Cardiovascular;  Laterality: N/A;   CYSTOSCOPY W/ STONE MANIPULATION  X 2   INGUINAL HERNIA REPAIR  01/01/2012   Procedure: LAPAROSCOPIC INGUINAL HERNIA;  Surgeon: Elon HERO Gail, MD;  Location: [facility];  Service: General;  Laterality: Left;   INGUINAL HERNIA REPAIR Right 2016   Dr. Gail FONTANA HERNIA REPAIR Left    INSERTION OF MESH  01/01/2012   Procedure: INSERTION OF MESH;  Surgeon: Elon HERO Gail, MD;  Location: [facility];  Service: General;  Laterality: Left;   LEFT HEART CATH AND  CORONARY ANGIOGRAPHY N/A 08/20/2016   Procedure: Left Heart Cath and Coronary Angiography;  Surgeon: Dann Candyce RAMAN, MD;  Location: [facility] INVASIVE CV LAB;  Service: Cardiovascular;  Laterality: N/A;   LITHOTRIPSY  2008   5 times total - Dr. Alline   LITHOTRIPSY  X ~ 5   MOHS SURGERY  2006   basal cell; face   NASAL SEPTUM SURGERY  2004   Dr. Mable   Family History  Problem Relation Age of Onset   Cancer Mother        throat   Heart attack Father 64   Social History   Occupational History   Not on file  Tobacco Use   Smoking status: Never   Smokeless tobacco: Never  Vaping Use   Vaping status: Never Used  Substance and Sexual Activity   Alcohol use: Yes    Alcohol/week: 5.0 - 8.0 standard drinks of alcohol    Types: 5 - 8 Shots of liquor per week   Drug use: No   Sexual activity: Yes   Tobacco Counseling Counseling given: Not Answered  SDOH Screenings   Food Insecurity: No Food Insecurity (11/25/2023)  Housing: Low Risk  (11/25/2023)  Transportation Needs: No Transportation Needs (11/25/2023)  Utilities: Not At Risk (12/16/2023)  Alcohol Screen: Low Risk  (11/25/2023)  Depression (PHQ2-9): Low Risk  (12/16/2023)  Recent Concern: Depression (PHQ2-9) - Medium Risk (09/30/2023)  Financial Resource Strain: Low Risk  (11/25/2023)  Physical Activity: Sufficiently Active (12/16/2023)  Recent Concern: Physical Activity -  Insufficiently Active (11/25/2023)  Social Connections: Socially Integrated (12/16/2023)  Stress: No Stress Concern Present (12/16/2023)  Recent Concern: Stress - Stress Concern Present (09/30/2023)  Tobacco Use: Low Risk  (12/16/2023)  Health Literacy: Adequate Health Literacy (12/16/2023)   Depression Screen    12/16/2023    8:34 AM 11/25/2023    7:28 AM 09/30/2023    8:10 AM 10/13/2022   10:22 AM 06/02/2021    7:41 AM 10/05/2016   10:40 AM 06/08/2016    8:47 AM  PHQ 2/9 Scores  PHQ - 2 Score 0 0 1 0 0 0 0  PHQ- 9 Score 0  0  5  0    0      Data  saved with a previous flowsheet row definition     Goals Addressed               This Visit's Progress     to lose 5 pounds, currently at 168 dry weight. (pt-stated)         Visit info / Clinical Intake: Medicare Wellness Visit Type:: Initial Annual Wellness Visit Persons participating in visit:: patient Medicare Wellness Visit Mode:: Telephone If telephone:: video declined Because this visit was a virtual/telehealth visit:: pt reported vitals Information given by:: patient Interpreter Needed?: No Pre-visit prep was completed: yes AWV questionnaire completed by patient prior to visit?: yes Date:: 12/15/23 Living arrangements:: lives with spouse/significant other Patient's Overall Health Status Rating: very good Typical amount of pain: none Does pain affect daily life?: no Are you currently prescribed opioids?: no  Dietary Habits and Nutritional Risks How many meals a day?: 2 Eats fruit and vegetables daily?: yes Most meals are obtained by: preparing own meals; eating out In the last 2 weeks, have you had any of the following?: none Diabetic:: no  Functional Status Activities of Daily Living (to include ambulation/medication): (Patient-Rptd) Independent Ambulation: (Patient-Rptd) Independent Medication Administration: Independent Home Management: (Patient-Rptd) Independent Manage your own finances?: yes Primary transportation is: driving Concerns about vision?: no *vision screening is required for [facility]* (Dr Francella / [facility].) Concerns about hearing?: (!) yes (wears hearing aids and no change from previous test) Uses hearing aids?: (!) yes  Fall Screening Falls in the past year?: 1 Number of falls in past year: (Patient-Rptd) 0 Was there an injury with Fall?: 1 Fall Risk Category Calculator: 2 Patient Fall Risk Level: Moderate Fall Risk  Fall Risk Patient at Risk for Falls Due to: No Fall Risks Fall risk Follow up: Falls evaluation completed  Home  and Transportation Safety: All rugs have non-skid backing?: yes All stairs or steps have railings?: N/A, no stairs Grab bars in the bathtub or shower?: yes Have non-skid surface in bathtub or shower?: yes Good home lighting?: yes Regular seat belt use?: yes Hospital stays in the last year:: no  Cognitive Assessment Difficulty concentrating, remembering, or making decisions? : no Will 6CIT or Mini Cog be Completed: yes What year is it?: 0 points What month is it?: 0 points Give patient an address phrase to remember (5 components): [address] [place]  About what time is it?: 0 points Count backwards from 20 to 1: 0 points Say the months of the year in reverse: 0 points Repeat the address phrase from earlier: 0 points 6 CIT Score: 0 points  Advance Directives (For Healthcare) Does Patient Have a Medical Advance Directive?: Yes Does patient want to make changes to medical advance directive?: No - Guardian declined Type of Advance Directive: Healthcare Power of  Attorney; Living will Copy of Healthcare Power of Attorney in Chart?: No - copy requested Copy of Living Will in Chart?: No - copy requested  Reviewed/Updated  Reviewed/Updated: All        Objective:    Today's Vitals   12/16/23 0819  Weight: 174 lb (78.9 kg)  Height: 5' 7 (1.702 m)   Body mass index is 27.25 kg/m.  Current Medications (verified) Outpatient Encounter Medications as of 12/16/2023  Medication Sig   aspirin  EC 81 MG tablet Take 1 tablet (81 mg total) by mouth daily. Swallow whole.   atorvastatin  (LIPITOR ) 40 MG tablet Take 1 tablet (40 mg total) by mouth daily.   baclofen  (LIORESAL ) 10 MG tablet Take 1 tablet (10 mg total) by mouth 3 (three) times daily.   diclofenac  (VOLTAREN ) 50 MG EC tablet Take 1 tablet (50 mg total) by mouth 2 (two) times daily.   ezetimibe  (ZETIA ) 10 MG tablet Take 1 tablet (10 mg total) by mouth daily.   hydrochlorothiazide  (MICROZIDE ) 12.5 MG capsule Take 1 capsule  (12.5 mg total) by mouth daily.   icosapent  Ethyl (VASCEPA ) 1 g capsule Take 2 capsules (2 g total) by mouth 2 (two) times daily.   losartan  (COZAAR ) 50 MG tablet Take 1 tablet (50 mg total) by mouth daily.   metoprolol  tartrate (LOPRESSOR ) 25 MG tablet Take 0.5 tablets (12.5 mg total) by mouth 2 (two) times daily.   Multiple Vitamin (MULTIVITAMIN WITH MINERALS) TABS Take 1 tablet by mouth daily. Takes on Mondays.   nitroGLYCERIN  (NITROSTAT ) 0.4 MG SL tablet Place 1 tablet (0.4 mg total) under the tongue every 5 (five) minutes as needed for chest pain.   triamcinolone (NASACORT) 55 MCG/ACT AERO nasal inhaler Place 2 sprays into the nose at bedtime. Each nostril   No facility-administered encounter medications on file as of 12/16/2023.   Hearing/Vision screen No results found. Immunizations and Health Maintenance Health Maintenance  Topic Date Due   Influenza Vaccine  05/09/2024 (Originally 09/10/2023)   COLON CANCER SCREENING ANNUAL FOBT  05/25/2024 (Originally 10/14/2023)   COVID-19 Vaccine (7 - 2025-26 season) 05/25/2024 (Originally 10/11/2023)   Colonoscopy  11/24/2024 (Originally 05/29/2002)   Medicare Annual Wellness (AWV)  12/15/2024   DTaP/Tdap/Td (3 - Tdap) 03/11/2033   Pneumococcal Vaccine: 50+ Years  Completed   Hepatitis C Screening  Completed   Zoster Vaccines- Shingrix  Completed   Meningococcal B Vaccine  Aged Out        Assessment/Plan:  This is a routine wellness examination for [facility].  Patient Care Team: Frann Mabel Mt, DO as PCP - General (Family Medicine) Pietro Redell RAMAN, MD as PCP - Cardiology (Cardiology) Gail Favorite, MD as Consulting Physician (General Surgery) Francella Fairy SAILOR, OD (Optometry)  I have personally reviewed and noted the following in the patient's chart:   Medical and social history Use of alcohol, tobacco or illicit drugs  Current medications and supplements including opioid prescriptions. Functional ability and status Nutritional  status Physical activity Advanced directives List of other physicians Hospitalizations, surgeries, and ER visits in previous 12 months Vitals Screenings to include cognitive, depression, and falls Referrals and appointments  No orders of the defined types were placed in this encounter.  In addition, I have reviewed and discussed with patient certain preventive protocols, quality metrics, and best practice recommendations. A written personalized care plan for preventive services as well as general preventive health recommendations were provided to patient.   Lolita Libra, CMA   12/21/2023   Return in 1 year (  on 12/15/2024).  After Visit Summary: (MyChart) Due to this being a telephonic visit, the after visit summary with patients personalized plan was offered to patient via MyChart   Nurse Notes: nothing significant to report

## 2023-12-21 NOTE — Addendum Note (Signed)
 Addended by: MAREE HUM A on: 12/21/2023 07:37 AM   Modules accepted: Level of Service

## 2023-12-21 NOTE — Telephone Encounter (Signed)
 This patient needs order for an appointment next Friday

## 2023-12-21 NOTE — Telephone Encounter (Signed)
 Chart reviewed, lipid panel ordered.

## 2023-12-27 ENCOUNTER — Other Ambulatory Visit: Payer: Self-pay | Admitting: Family Medicine

## 2023-12-31 ENCOUNTER — Other Ambulatory Visit

## 2024-01-03 ENCOUNTER — Ambulatory Visit: Payer: Self-pay | Admitting: Family Medicine

## 2024-01-03 ENCOUNTER — Other Ambulatory Visit (INDEPENDENT_AMBULATORY_CARE_PROVIDER_SITE_OTHER)

## 2024-01-03 DIAGNOSIS — E781 Pure hyperglyceridemia: Secondary | ICD-10-CM

## 2024-01-03 LAB — LIPID PANEL
Cholesterol: 85 mg/dL (ref 0–200)
HDL: 38.8 mg/dL — ABNORMAL LOW (ref >39.00)
LDL Cholesterol: 18 mg/dL (ref 0–99)
NonHDL: 46.4
Total CHOL/HDL Ratio: 2
Triglycerides: 143 mg/dL (ref 0.0–149.0)
VLDL: 28.6 mg/dL (ref 0.0–40.0)

## 2024-01-25 ENCOUNTER — Other Ambulatory Visit: Payer: Self-pay | Admitting: Family Medicine

## 2024-03-13 ENCOUNTER — Telehealth: Payer: Self-pay | Admitting: Cardiology

## 2024-03-14 ENCOUNTER — Other Ambulatory Visit (HOSPITAL_COMMUNITY): Payer: Self-pay

## 2024-03-14 ENCOUNTER — Encounter: Payer: Self-pay | Admitting: Cardiology

## 2024-03-14 ENCOUNTER — Telehealth: Payer: Self-pay | Admitting: Pharmacy Technician

## 2024-03-14 NOTE — Telephone Encounter (Signed)
" ° °  Patient Advocate Encounter   The patient was approved for a [employer] grant that will help cover the cost of vascepa /icosapent  Total amount awarded, 2500.  Effective: 02/13/24 - 02/11/25   APW:[id number] ERW:[id number] Hmnle:[id number] PI:[id number] [employer] ID: [id number]   Pharmacy provided with approval and processing information. Patient informed via mychart  "

## 2024-03-14 NOTE — Telephone Encounter (Signed)
 Effective: 02/13/24 - 02/11/25   APW:[id number] ERW:[id number] Hmnle:[id number] PI:[id number]  Given to patient and pharmacy

## 2024-03-15 ENCOUNTER — Other Ambulatory Visit (HOSPITAL_COMMUNITY): Payer: Self-pay

## 2024-11-27 ENCOUNTER — Encounter: Admitting: Family Medicine

## 2024-12-19 ENCOUNTER — Ambulatory Visit
